# Patient Record
Sex: Female | Born: 1985
Health system: Southern US, Community
[De-identification: ages and names within clinical notes are randomized; demographics above are authoritative.]

## PROBLEM LIST (undated history)

## (undated) DIAGNOSIS — T7840XA Allergy, unspecified, initial encounter: Secondary | ICD-10-CM

## (undated) DIAGNOSIS — K219 Gastro-esophageal reflux disease without esophagitis: Secondary | ICD-10-CM

## (undated) DIAGNOSIS — O039 Complete or unspecified spontaneous abortion without complication: Secondary | ICD-10-CM

## (undated) DIAGNOSIS — A749 Chlamydial infection, unspecified: Secondary | ICD-10-CM

## (undated) DIAGNOSIS — D649 Anemia, unspecified: Secondary | ICD-10-CM

## (undated) DIAGNOSIS — O24419 Gestational diabetes mellitus in pregnancy, unspecified control: Secondary | ICD-10-CM

## (undated) DIAGNOSIS — Z5189 Encounter for other specified aftercare: Secondary | ICD-10-CM

## (undated) HISTORY — PX: NO PAST SURGERIES: SHX2092

## (undated) HISTORY — DX: Encounter for other specified aftercare: Z51.89

## (undated) HISTORY — DX: Gastro-esophageal reflux disease without esophagitis: K21.9

## (undated) HISTORY — DX: Anemia, unspecified: D64.9

## (undated) HISTORY — DX: Complete or unspecified spontaneous abortion without complication: O03.9

## (undated) HISTORY — PX: WISDOM TOOTH EXTRACTION: SHX21

## (undated) HISTORY — DX: Allergy, unspecified, initial encounter: T78.40XA

---

## 2004-11-30 ENCOUNTER — Inpatient Hospital Stay (HOSPITAL_COMMUNITY): Admission: AC | Admit: 2004-11-30 | Discharge: 2004-11-30 | Payer: Self-pay

## 2004-12-07 ENCOUNTER — Emergency Department (HOSPITAL_COMMUNITY): Admission: EM | Admit: 2004-12-07 | Discharge: 2004-12-07 | Payer: Self-pay | Admitting: Emergency Medicine

## 2004-12-22 ENCOUNTER — Other Ambulatory Visit: Admission: RE | Admit: 2004-12-22 | Discharge: 2004-12-22 | Payer: Self-pay | Admitting: Family Medicine
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
## 2017-02-26 DIAGNOSIS — J305 Allergic rhinitis due to food: Secondary | ICD-10-CM | POA: Diagnosis not present

## 2017-03-25 DIAGNOSIS — L700 Acne vulgaris: Secondary | ICD-10-CM | POA: Diagnosis not present

## 2017-06-11 DIAGNOSIS — L700 Acne vulgaris: Secondary | ICD-10-CM | POA: Diagnosis not present

## 2017-09-20 ENCOUNTER — Ambulatory Visit: Payer: Self-pay | Admitting: Certified Nurse Midwife

## 2017-10-08 DIAGNOSIS — Z23 Encounter for immunization: Secondary | ICD-10-CM | POA: Diagnosis not present

## 2017-11-26 DIAGNOSIS — N911 Secondary amenorrhea: Secondary | ICD-10-CM | POA: Diagnosis not present

## 2017-11-28 ENCOUNTER — Ambulatory Visit (INDEPENDENT_AMBULATORY_CARE_PROVIDER_SITE_OTHER): Payer: 59 | Admitting: Internal Medicine

## 2017-11-28 ENCOUNTER — Encounter: Payer: Self-pay | Admitting: Internal Medicine

## 2017-11-28 VITALS — BP 116/66 | HR 74 | Temp 97.8°F | Ht 63.5 in | Wt 167.0 lb

## 2017-11-28 DIAGNOSIS — K219 Gastro-esophageal reflux disease without esophagitis: Secondary | ICD-10-CM | POA: Insufficient documentation

## 2017-11-28 DIAGNOSIS — Z3401 Encounter for supervision of normal first pregnancy, first trimester: Secondary | ICD-10-CM | POA: Diagnosis not present

## 2017-11-28 DIAGNOSIS — Z3A01 Less than 8 weeks gestation of pregnancy: Secondary | ICD-10-CM

## 2017-11-28 DIAGNOSIS — J309 Allergic rhinitis, unspecified: Secondary | ICD-10-CM | POA: Diagnosis not present

## 2017-11-28 LAB — URINALYSIS, ROUTINE W REFLEX MICROSCOPIC
Bilirubin Urine: NEGATIVE
Hgb urine dipstick: NEGATIVE
Ketones, ur: NEGATIVE
Leukocytes, UA: NEGATIVE
Nitrite: NEGATIVE
Specific Gravity, Urine: 1.015 (ref 1.000–1.030)
Total Protein, Urine: NEGATIVE
Urine Glucose: NEGATIVE
Urobilinogen, UA: 0.2 (ref 0.0–1.0)
pH: 7 (ref 5.0–8.0)

## 2017-11-28 LAB — COMPREHENSIVE METABOLIC PANEL
ALT: 19 U/L (ref 0–35)
AST: 17 U/L (ref 0–37)
Albumin: 4.4 g/dL (ref 3.5–5.2)
Alkaline Phosphatase: 72 U/L (ref 39–117)
BUN: 14 mg/dL (ref 6–23)
CO2: 25 mEq/L (ref 19–32)
Calcium: 9.7 mg/dL (ref 8.4–10.5)
Chloride: 102 mEq/L (ref 96–112)
Creatinine, Ser: 0.56 mg/dL (ref 0.40–1.20)
GFR: 133.23 mL/min (ref >60.00)
Glucose, Bld: 101 mg/dL — ABNORMAL HIGH (ref 70–99)
Potassium: 3.9 mEq/L (ref 3.5–5.1)
Sodium: 135 mEq/L (ref 135–145)
Total Bilirubin: 0.6 mg/dL (ref 0.2–1.2)
Total Protein: 7.7 g/dL (ref 6.0–8.3)

## 2017-11-28 LAB — T4, FREE: Free T4: 0.96 ng/dL (ref 0.60–1.60)

## 2017-11-28 LAB — LIPID PANEL
Cholesterol: 134 mg/dL (ref 0–200)
HDL: 49 mg/dL (ref >39.00)
LDL Cholesterol: 69 mg/dL (ref 0–99)
NonHDL: 84.52
Total CHOL/HDL Ratio: 3
Triglycerides: 79 mg/dL (ref 0.0–149.0)
VLDL: 15.8 mg/dL (ref 0.0–40.0)

## 2017-11-28 LAB — CBC
HCT: 43.5 % (ref 36.0–46.0)
Hemoglobin: 14.2 g/dL (ref 12.0–15.0)
MCHC: 32.7 g/dL (ref 30.0–36.0)
MCV: 85.6 fl (ref 78.0–100.0)
Platelets: 348 10*3/uL (ref 150.0–400.0)
RBC: 5.08 Mil/uL (ref 3.87–5.11)
RDW: 13.5 % (ref 11.5–15.5)
WBC: 11.8 10*3/uL — ABNORMAL HIGH (ref 4.0–10.5)

## 2017-11-28 LAB — TSH: TSH: 1.32 u[IU]/mL (ref 0.35–4.50)

## 2017-11-28 NOTE — Progress Notes (Signed)
Chief Complaint  Patient presents with  . Annual Exam  . pregnant [redacted] weeks   New patient needs to establish last PCP Eldridge Abrahams FNP 1. [redacted] weeks pregnant 1st pregnancy doing well OB/GYN is Dr. [facility] [place] she had initial appt and another for b/w upcoming  2. GERD intermittently controlled on Ranitidine 150 mg qd will try to avoid eating late a night which can make worse  3. Allergies on Singulair reviewed pregnancy safety unkown will hold for now     Review of Systems  Constitutional: Negative for weight loss.  HENT: Negative for sore throat.   Eyes:       +wears glasses   Respiratory: Negative for shortness of breath.   Cardiovascular: Negative for chest pain.  Gastrointestinal: Negative for abdominal pain and nausea.  Genitourinary: Negative for dysuria.  Musculoskeletal: Negative for joint pain.  Skin: Negative for rash.  Psychiatric/Behavioral: Negative for depression.   Past Medical History:  Diagnosis Date  . Allergy   . GERD (gastroesophageal reflux disease)    Past Surgical History:  Procedure Laterality Date  . WISDOM TOOTH EXTRACTION     Family History  Problem Relation Age of Onset  . Gout Mother   . Hypertension Mother   . Hypertension Father   . Diabetes Father   . Gestational diabetes Sister   . Cancer Other        breast    Social History   Socioeconomic History  . Marital status: Married    Spouse name: Not on file  . Number of children: Not on file  . Years of education: Not on file  . Highest education level: Not on file  Social Needs  . Financial resource strain: Not on file  . Food insecurity - worry: Not on file  . Food insecurity - inability: Not on file  . Transportation needs - medical: Not on file  . Transportation needs - non-medical: Not on file  Occupational History  . Not on file  Tobacco Use  . Smoking status: Never Smoker  . Smokeless tobacco: Never Used  Substance and Sexual Activity   . Alcohol use: No    Frequency: Never  . Drug use: No  . Sexual activity: Not on file  Other Topics Concern  . 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 cetirizine (ZYRTEC) 10 MG tablet Take 10 mg by mouth.  . montelukast (SINGULAIR) 10 MG tablet Take 10 mg by mouth.  . Prenatal Vit-Fe Fumarate-FA (MULTIVITAMIN-PRENATAL) 27-0.8 MG TABS tablet Take 1 tablet by mouth daily at 12 noon.  . ranitidine (ZANTAC) 150 MG capsule Take 150 mg by mouth.  . triamcinolone (NASACORT ALLERGY 24HR) 55 MCG/ACT AERO nasal inhaler Place 2 sprays into the nose daily.   Allergies  Allergen Reactions  . Other Itching    Shellfish caused itching hands with touching but still eats shellfish    No results found for this or any previous visit (from the past 2160 hour(s)). Objective  Body mass index is 29.12 kg/m. Wt Readings from Last 3 Encounters:  11/28/17 167 lb (75.8 kg)   Temp Readings from Last 3 Encounters:  11/28/17 97.8 F (36.6 C) (Oral)   BP Readings from Last 3 Encounters:  11/28/17 116/66   Pulse Readings  from Last 3 Encounters:  11/28/17 74   Pulse oximetry on room air is 99% Physical Exam  Constitutional: She is oriented to person, place, and time and well-developed, well-nourished, and in no distress. Vital signs are normal.  HENT:  Head: Normocephalic and atraumatic.  Mouth/Throat: Oropharynx is clear and moist and mucous membranes are normal.  Eyes: Conjunctivae are normal. Pupils are equal, round, and reactive to light.  Cardiovascular: Normal rate, regular rhythm and normal heart sounds.  Neg leg edema b/l   Pulmonary/Chest: Effort normal and breath sounds normal.  Abdominal: Normal appearance. There is no tenderness.  Neurological: She is alert and oriented to person, place, and time. Gait normal. Gait normal.  Skin: Skin is warm, dry and  intact.  Psychiatric: Mood, memory, affect and judgment normal.  Nursing note and vitals reviewed.  Assessment   1. [redacted] weeks pregnant due 07/17/17  2. GERD 3. Allergic rhinitis  4. HM Plan  1. Do labs CMET, CBC, TSH, lipid, UA, hep B immune and mail to pt  2. Cont meds  3. Will stop Singulair for now  4. Had flu shot 09/2017, Tdap 08/19/13  Pap upcoming OB/GYN h/o nl paps and h/o tx'ed chlamydia in past per pt   Wearing seatbelt and feels safe in relationship   Provider: Dr. Olivia Mackie McLean-Scocuzza

## 2017-11-28 NOTE — Patient Instructions (Addendum)
Please hold singulair for now  Check Tdap status  We will do labs today CMET, CBC, TSH, UA, lipid, hepatitis B immunity Happy Holidays   Heartburn Heartburn is a type of pain or discomfort that can happen in the throat or chest. It is often described as a burning pain. It may also cause a bad taste in the mouth. Heartburn may feel worse when you lie down or bend over. It may be caused by stomach contents that move back up (reflux) into the tube that connects the mouth with the stomach (esophagus). Follow these instructions at home: Take these actions to lessen your discomfort and to help avoid problems. Diet  Follow a diet as told by your doctor. You may need to avoid foods and drinks such as: ? Coffee and tea (with or without caffeine). ? Drinks that contain alcohol. ? Energy drinks and sports drinks. ? Carbonated drinks or sodas. ? Chocolate and cocoa. ? Peppermint and mint flavorings. ? Garlic and onions. ? Horseradish. ? Spicy and acidic foods, such as peppers, chili powder, curry powder, vinegar, hot sauces, and BBQ sauce. ? Citrus fruit juices and citrus fruits, such as oranges, lemons, and limes. ? Tomato-based foods, such as red sauce, chili, salsa, and pizza with red sauce. ? Fried and fatty foods, such as donuts, french fries, potato chips, and high-fat dressings. ? High-fat meats, such as hot dogs, rib eye steak, sausage, ham, and bacon. ? High-fat dairy items, such as whole milk, butter, and cream cheese.  Eat small meals often. Avoid eating large meals.  Avoid drinking large amounts of liquid with your meals.  Avoid eating meals during the 2-3 hours before bedtime.  Avoid lying down right after you eat.  Do not exercise right after you eat. General instructions  Pay attention to any changes in your symptoms.  Take over-the-counter and prescription medicines only as told by your doctor. Do not take aspirin, ibuprofen, or other NSAIDs unless your doctor says it is  okay.  Do not use any tobacco products, including cigarettes, chewing tobacco, and e-cigarettes. If you need help quitting, ask your doctor.  Wear loose clothes. Do not wear anything tight around your waist.  Raise (elevate) the head of your bed about 6 inches (15 cm).  Try to lower your stress. If you need help doing this, ask your doctor.  If you are overweight, lose an amount of weight that is healthy for you. Ask your doctor about a safe weight loss goal.  Keep all follow-up visits as told by your doctor. This is important. Contact a doctor if:  You have new symptoms.  You lose weight and you do not know why it is happening.  You have trouble swallowing, or it hurts to swallow.  You have wheezing or a cough that keeps happening.  Your symptoms do not get better with treatment.  You have heartburn often for more than two weeks. Get help right away if:  You have pain in your arms, neck, jaw, teeth, or back.  You feel sweaty, dizzy, or light-headed.  You have chest pain or shortness of breath.  You throw up (vomit) and your throw up looks like blood or coffee grounds.  Your poop (stool) is bloody or black. This information is not intended to replace advice given to you by your health care provider. Make sure you discuss any questions you have with your health care provider. Document Released: 08/29/2011 Document Revised: 05/24/2016 Document Reviewed: 04/13/2015 Elsevier Interactive Patient Education  2018 [place] for Gastroesophageal Reflux Disease, Adult When you have gastroesophageal reflux disease (GERD), the foods you eat and your eating habits are very important. Choosing the right foods can help ease your discomfort. What guidelines do I need to follow?  Choose fruits, vegetables, whole grains, and low-fat dairy products.  Choose low-fat meat, fish, and poultry.  Limit fats such as oils, salad dressings, butter, nuts, and avocado.  Keep a  food diary. This helps you identify foods that cause symptoms.  Avoid foods that cause symptoms. These may be different for everyone.  Eat small meals often instead of 3 large meals a day.  Eat your meals slowly, in a place where you are relaxed.  Limit fried foods.  Cook foods using methods other than frying.  Avoid drinking alcohol.  Avoid drinking large amounts of liquids with your meals.  Avoid bending over or lying down until 2-3 hours after eating. What foods are not recommended? These are some foods and drinks that may make your symptoms worse: Vegetables Tomatoes. Tomato juice. Tomato and spaghetti sauce. Chili peppers. Onion and garlic. Horseradish. Fruits Oranges, grapefruit, and lemon (fruit and juice). Meats High-fat meats, fish, and poultry. This includes hot dogs, ribs, ham, sausage, salami, and bacon. Dairy Whole milk and chocolate milk. Sour cream. Cream. Butter. Ice cream. Cream cheese. Drinks Coffee and tea. Bubbly (carbonated) drinks or energy drinks. Condiments Hot sauce. Barbecue sauce. Sweets/Desserts Chocolate and cocoa. Donuts. Peppermint and spearmint. Fats and Oils High-fat foods. This includes [place] fries and potato chips. Other Vinegar. Strong spices. This includes black pepper, white pepper, red pepper, cayenne, curry powder, cloves, ginger, and chili powder. The items listed above may not be a complete list of foods and drinks to avoid. Contact your dietitian for more information. This information is not intended to replace advice given to you by your health care provider. Make sure you discuss any questions you have with your health care provider. Document Released: 06/17/2012 Document Revised: 05/24/2016 Document Reviewed: 10/21/2013 Elsevier Interactive Patient Education  2017 [employer].  How a Baby Grows During Pregnancy Pregnancy begins when a female's sperm enters a female's egg (fertilization). This happens in one of the tubes  (fallopian tubes) that connect the ovaries to the womb (uterus). The fertilized egg is called an embryo until it reaches 10 weeks. From 10 weeks until birth, it is called a fetus. The fertilized egg moves down the fallopian tube to the uterus. Then it implants into the lining of the uterus and begins to grow. The developing fetus receives oxygen and nutrients through the pregnant woman's bloodstream and the tissues that grow (placenta) to support the fetus. The placenta is the life support system for the fetus. It provides nutrition and removes waste. Learning as much as you can about your pregnancy and how your baby is developing can help you enjoy the experience. It can also make you aware of when there might be a problem and when to ask questions. How long does a typical pregnancy last? A pregnancy usually lasts 280 days, or about 40 weeks. Pregnancy is divided into three trimesters:  First trimester: 0-13 weeks.  Second trimester: 14-27 weeks.  Third trimester: 28-40 weeks.  The day when your baby is considered ready to be born (full term) is your estimated date of delivery. How does my baby develop month by month? First month  The fertilized egg attaches to the inside of the uterus.  Some cells will form the placenta. Others  will form the fetus.  The arms, legs, brain, spinal cord, lungs, and heart begin to develop.  At the end of the first month, the heart begins to beat.  Second month  The bones, inner ear, eyelids, hands, and feet form.  The genitals develop.  By the end of 8 weeks, all major organs are developing.  Third month  All of the internal organs are forming.  Teeth develop below the gums.  Bones and muscles begin to grow. The spine can flex.  The skin is transparent.  Fingernails and toenails begin to form.  Arms and legs continue to grow longer, and hands and feet develop.  The fetus is about 3 in (7.6 cm) long.  Fourth month  The placenta is  completely formed.  The external sex organs, neck, outer ear, eyebrows, eyelids, and fingernails are formed.  The fetus can hear, swallow, and move its arms and legs.  The kidneys begin to produce urine.  The skin is covered with a white waxy coating (vernix) and very fine hair (lanugo).  Fifth month  The fetus moves around more and can be felt for the first time (quickening).  The fetus starts to sleep and wake up and may begin to suck its finger.  The nails grow to the end of the fingers.  The organ in the digestive system that makes bile (gallbladder) functions and helps to digest the nutrients.  If your baby is a girl, eggs are present in her ovaries. If your baby is a boy, testicles start to move down into his scrotum.  Sixth month  The lungs are formed, but the fetus is not yet able to breathe.  The eyes open. The brain continues to develop.  Your baby has fingerprints and toe prints. Your baby's hair grows thicker.  At the end of the second trimester, the fetus is about 9 in (22.9 cm) long.  Seventh month  The fetus kicks and stretches.  The eyes are developed enough to sense changes in light.  The hands can make a grasping motion.  The fetus responds to sound.  Eighth month  All organs and body systems are fully developed and functioning.  Bones harden and taste buds develop. The fetus may hiccup.  Certain areas of the brain are still developing. The skull remains soft.  Ninth month  The fetus gains about  lb (0.23 kg) each week.  The lungs are fully developed.  Patterns of sleep develop.  The fetus's head typically moves into a head-down position (vertex) in the uterus to prepare for birth. If the buttocks move into a vertex position instead, the baby is breech.  The fetus weighs 6-9 lbs (2.72-4.08 kg) and is 19-20 in (48.26-50.8 cm) long.  What can I do to have a healthy pregnancy and help my baby develop? Eating and Drinking  Eat a healthy  diet. ? Talk with your health care provider to make sure that you are getting the nutrients that you and your baby need. ? Visit www.BuildDNA.es to learn about creating a healthy diet.  Gain a healthy amount of weight during pregnancy as advised by your health care provider. This is usually 25-35 pounds. You may need to: ? Gain more if you were underweight before getting pregnant or if you are pregnant with more than one baby. ? Gain less if you were overweight or obese when you got pregnant.  Medicines and Vitamins  Take prenatal vitamins as directed by your health care provider. These include  vitamins such as folic acid, iron, calcium, and vitamin D. They are important for healthy development.  Take medicines only as directed by your health care provider. Read labels and ask a pharmacist or your health care provider whether over-the-counter medicines, supplements, and prescription drugs are safe to take during pregnancy.  Activities  Be physically active as advised by your health care provider. Ask your health care provider to recommend activities that are safe for you to do, such as walking or swimming.  Do not participate in strenuous or extreme sports.  Lifestyle  Do not drink alcohol.  Do not use any tobacco products, including cigarettes, chewing tobacco, or electronic cigarettes. If you need help quitting, ask your health care provider.  Do not use illegal drugs.  Safety  Avoid exposure to mercury, lead, or other heavy metals. Ask your health care provider about common sources of these heavy metals.  Avoid listeria infection during pregnancy. Follow these precautions: ? Do not eat soft cheeses or deli meats. ? Do not eat hot dogs unless they have been warmed up to the point of steaming, such as in the microwave oven. ? Do not drink unpasteurized milk.  Avoid toxoplasmosis infection during pregnancy. Follow these precautions: ? Do not change your cat's litter box,  if you have a cat. Ask someone else to do this for you. ? Wear gardening gloves while working in the yard.  General Instructions  Keep all follow-up visits as directed by your health care provider. This is important. This includes prenatal care and screening tests.  Manage any chronic health conditions. Work closely with your health care provider to keep conditions, such as diabetes, under control.  How do I know if my baby is developing well? At each prenatal visit, your health care provider will do several different tests to check on your health and keep track of your baby's development. These include:  Fundal height. ? Your health care provider will measure your growing belly from top to bottom using a tape measure. ? Your health care provider will also feel your belly to determine your baby's position.  Heartbeat. ? An ultrasound in the first trimester can confirm pregnancy and show a heartbeat, depending on how far along you are. ? Your health care provider will check your baby's heart rate at every prenatal visit. ? As you get closer to your delivery date, you may have regular fetal heart rate monitoring to make sure that your baby is not in distress.  Second trimester ultrasound. ? This ultrasound checks your baby's development. It also indicates your baby's gender.  What should I do if I have concerns about my baby's development? Always talk with your health care provider about any concerns that you may have. This information is not intended to replace advice given to you by your health care provider. Make sure you discuss any questions you have with your health care provider. Document Released: 06/04/2008 Document Revised: 05/24/2016 Document Reviewed: 05/26/2014 Elsevier Interactive Patient Education  [employer].

## 2017-11-29 LAB — HEPATITIS B SURFACE ANTIBODY, QUANTITATIVE: Hepatitis B-Post: 111 m[IU]/mL (ref >=10)

## 2017-12-03 LAB — OB RESULTS CONSOLE ABO/RH: RH Type: POSITIVE

## 2017-12-03 LAB — OB RESULTS CONSOLE RUBELLA ANTIBODY, IGM: Rubella: IMMUNE

## 2017-12-03 LAB — OB RESULTS CONSOLE HEPATITIS B SURFACE ANTIGEN: Hepatitis B Surface Ag: NEGATIVE

## 2017-12-03 LAB — OB RESULTS CONSOLE RPR: RPR: NONREACTIVE

## 2017-12-03 LAB — OB RESULTS CONSOLE GC/CHLAMYDIA
Chlamydia: NEGATIVE
Gonorrhea: NEGATIVE

## 2017-12-03 LAB — OB RESULTS CONSOLE HIV ANTIBODY (ROUTINE TESTING): HIV: NONREACTIVE

## 2017-12-31 NOTE — L&D Delivery Note (Signed)
Delivery Note At 9:12 PM a viable female was delivered via Vaginal, Spontaneous (Presentation: ROA).  APGAR: 9, 9; weight pending.   Placenta status: S, I. 3V Cord with the following complications: none.  Cord pH: n/a  Anesthesia:  CLEA Episiotomy: None Lacerations: 2nd degree Suture Repair: 3.0 vicryl rapide Est. Blood Loss (mL):  250  Mom to postpartum.  Baby to Couplet care / Skin to Skin.  Kimberly Cortez, [place] 07/07/2018, 9:40 PM

## 2018-05-07 ENCOUNTER — Encounter: Payer: 59 | Attending: Obstetrics & Gynecology | Admitting: Registered"

## 2018-05-07 DIAGNOSIS — O99810 Abnormal glucose complicating pregnancy: Secondary | ICD-10-CM | POA: Diagnosis present

## 2018-05-07 DIAGNOSIS — Z3A00 Weeks of gestation of pregnancy not specified: Secondary | ICD-10-CM | POA: Diagnosis not present

## 2018-05-12 ENCOUNTER — Encounter: Payer: Self-pay | Admitting: Registered"

## 2018-05-12 DIAGNOSIS — O99810 Abnormal glucose complicating pregnancy: Secondary | ICD-10-CM | POA: Insufficient documentation

## 2018-05-12 NOTE — Progress Notes (Signed)
Patient was seen on 05/07/2018 for Gestational Diabetes self-management class at the [facility]. The following learning objectives were met by the patient during this course:   States the definition of Gestational Diabetes  States why dietary management is important in controlling blood glucose  Describes the effects each nutrient has on blood glucose levels  Demonstrates ability to create a balanced meal plan  Demonstrates carbohydrate counting   States when to check blood glucose levels  Demonstrates proper blood glucose monitoring techniques  States the effect of stress and exercise on blood glucose levels  States the importance of limiting caffeine and abstaining from alcohol and smoking  Blood glucose monitor given: [employer] Lot # [id number] X Exp: 03/31/2019 Blood glucose reading: 105 mg/dL  Patient instructed to monitor glucose levels: FBS: 60 - <95; 1 hour: <140; 2 hour: <120  Patient received handouts:  Nutrition Diabetes and Pregnancy, including carb counting list  Patient will be seen for follow-up as needed.

## 2018-05-30 DIAGNOSIS — Z3A33 33 weeks gestation of pregnancy: Secondary | ICD-10-CM | POA: Diagnosis not present

## 2018-05-30 DIAGNOSIS — O24419 Gestational diabetes mellitus in pregnancy, unspecified control: Secondary | ICD-10-CM | POA: Diagnosis not present

## 2018-06-02 DIAGNOSIS — Z3A33 33 weeks gestation of pregnancy: Secondary | ICD-10-CM | POA: Diagnosis not present

## 2018-06-02 DIAGNOSIS — O368330 Maternal care for abnormalities of the fetal heart rate or rhythm, third trimester, not applicable or unspecified: Secondary | ICD-10-CM | POA: Diagnosis not present

## 2018-06-05 DIAGNOSIS — O9989 Other specified diseases and conditions complicating pregnancy, childbirth and the puerperium: Secondary | ICD-10-CM | POA: Diagnosis not present

## 2018-06-05 DIAGNOSIS — Z3A34 34 weeks gestation of pregnancy: Secondary | ICD-10-CM | POA: Diagnosis not present

## 2018-06-09 DIAGNOSIS — O9989 Other specified diseases and conditions complicating pregnancy, childbirth and the puerperium: Secondary | ICD-10-CM | POA: Diagnosis not present

## 2018-06-09 DIAGNOSIS — Z3A34 34 weeks gestation of pregnancy: Secondary | ICD-10-CM | POA: Diagnosis not present

## 2018-06-12 DIAGNOSIS — Z3A35 35 weeks gestation of pregnancy: Secondary | ICD-10-CM | POA: Diagnosis not present

## 2018-06-12 DIAGNOSIS — O24410 Gestational diabetes mellitus in pregnancy, diet controlled: Secondary | ICD-10-CM | POA: Diagnosis not present

## 2018-06-17 DIAGNOSIS — Z3480 Encounter for supervision of other normal pregnancy, unspecified trimester: Secondary | ICD-10-CM | POA: Diagnosis not present

## 2018-06-17 DIAGNOSIS — Z3A35 35 weeks gestation of pregnancy: Secondary | ICD-10-CM | POA: Diagnosis not present

## 2018-06-17 DIAGNOSIS — O24419 Gestational diabetes mellitus in pregnancy, unspecified control: Secondary | ICD-10-CM | POA: Diagnosis not present

## 2018-06-20 DIAGNOSIS — Z3A36 36 weeks gestation of pregnancy: Secondary | ICD-10-CM | POA: Diagnosis not present

## 2018-06-20 DIAGNOSIS — O9989 Other specified diseases and conditions complicating pregnancy, childbirth and the puerperium: Secondary | ICD-10-CM | POA: Diagnosis not present

## 2018-06-24 DIAGNOSIS — O9989 Other specified diseases and conditions complicating pregnancy, childbirth and the puerperium: Secondary | ICD-10-CM | POA: Diagnosis not present

## 2018-06-24 DIAGNOSIS — Z3A36 36 weeks gestation of pregnancy: Secondary | ICD-10-CM | POA: Diagnosis not present

## 2018-06-27 ENCOUNTER — Telehealth (HOSPITAL_COMMUNITY): Payer: Self-pay | Admitting: *Deleted

## 2018-06-27 ENCOUNTER — Encounter (HOSPITAL_COMMUNITY): Payer: Self-pay | Admitting: *Deleted

## 2018-06-27 DIAGNOSIS — O9989 Other specified diseases and conditions complicating pregnancy, childbirth and the puerperium: Secondary | ICD-10-CM | POA: Diagnosis not present

## 2018-06-27 DIAGNOSIS — Z3A37 37 weeks gestation of pregnancy: Secondary | ICD-10-CM | POA: Diagnosis not present

## 2018-06-30 NOTE — Telephone Encounter (Signed)
Preadmission screen  

## 2018-07-01 ENCOUNTER — Telehealth (HOSPITAL_COMMUNITY): Payer: Self-pay | Admitting: *Deleted

## 2018-07-01 ENCOUNTER — Encounter (HOSPITAL_COMMUNITY): Payer: Self-pay | Admitting: *Deleted

## 2018-07-01 DIAGNOSIS — O24419 Gestational diabetes mellitus in pregnancy, unspecified control: Secondary | ICD-10-CM | POA: Diagnosis not present

## 2018-07-01 DIAGNOSIS — Z3A37 37 weeks gestation of pregnancy: Secondary | ICD-10-CM | POA: Diagnosis not present

## 2018-07-01 NOTE — Telephone Encounter (Signed)
Preadmission screen  

## 2018-07-04 DIAGNOSIS — Z13228 Encounter for screening for other metabolic disorders: Secondary | ICD-10-CM | POA: Diagnosis not present

## 2018-07-04 DIAGNOSIS — Z130 Encounter for screening for diseases of the blood and blood-forming organs and certain disorders involving the immune mechanism: Secondary | ICD-10-CM | POA: Diagnosis not present

## 2018-07-07 ENCOUNTER — Other Ambulatory Visit: Payer: Self-pay

## 2018-07-07 ENCOUNTER — Inpatient Hospital Stay (HOSPITAL_COMMUNITY)
Admission: AD | Admit: 2018-07-07 | Discharge: 2018-07-09 | DRG: 807 | Disposition: A | Discharge status: Home / Self Care | Payer: 59 | Attending: Obstetrics & Gynecology | Admitting: Obstetrics & Gynecology

## 2018-07-07 ENCOUNTER — Encounter (HOSPITAL_COMMUNITY): Payer: Self-pay | Admitting: *Deleted

## 2018-07-07 ENCOUNTER — Inpatient Hospital Stay (HOSPITAL_COMMUNITY): Payer: 59 | Admitting: Anesthesiology

## 2018-07-07 DIAGNOSIS — O24425 Gestational diabetes mellitus in childbirth, controlled by oral hypoglycemic drugs: Secondary | ICD-10-CM | POA: Diagnosis present

## 2018-07-07 DIAGNOSIS — O4292 Full-term premature rupture of membranes, unspecified as to length of time between rupture and onset of labor: Principal | ICD-10-CM | POA: Diagnosis present

## 2018-07-07 DIAGNOSIS — Z3A38 38 weeks gestation of pregnancy: Secondary | ICD-10-CM

## 2018-07-07 DIAGNOSIS — O24429 Gestational diabetes mellitus in childbirth, unspecified control: Secondary | ICD-10-CM | POA: Diagnosis not present

## 2018-07-07 DIAGNOSIS — O4290 Premature rupture of membranes, unspecified as to length of time between rupture and onset of labor, unspecified weeks of gestation: Secondary | ICD-10-CM | POA: Diagnosis present

## 2018-07-07 DIAGNOSIS — Z3490 Encounter for supervision of normal pregnancy, unspecified, unspecified trimester: Secondary | ICD-10-CM

## 2018-07-07 HISTORY — DX: Gestational diabetes mellitus in pregnancy, unspecified control: O24.419

## 2018-07-07 HISTORY — DX: Chlamydial infection, unspecified: A74.9

## 2018-07-07 LAB — CBC
HCT: 35.6 % — ABNORMAL LOW (ref 36.0–46.0)
Hemoglobin: 11.8 g/dL — ABNORMAL LOW (ref 12.0–15.0)
MCH: 27.4 pg (ref 26.0–34.0)
MCHC: 33.1 g/dL (ref 30.0–36.0)
MCV: 82.6 fL (ref 78.0–100.0)
Platelets: 221 10*3/uL (ref 150–400)
RBC: 4.31 MIL/uL (ref 3.87–5.11)
RDW: 14.2 % (ref 11.5–15.5)
WBC: 10.9 10*3/uL — AB (ref 4.0–10.5)

## 2018-07-07 LAB — GLUCOSE, CAPILLARY
GLUCOSE-CAPILLARY: 82 mg/dL (ref 70–99)
Glucose-Capillary: 78 mg/dL (ref 70–99)
Glucose-Capillary: 77 mg/dL (ref 70–99)

## 2018-07-07 LAB — TYPE AND SCREEN
ABO/RH(D): O POS
ANTIBODY SCREEN: NEGATIVE

## 2018-07-07 LAB — ABO/RH: ABO/RH(D): O POS

## 2018-07-07 LAB — POCT FERN TEST: POCT Fern Test: POSITIVE

## 2018-07-07 MED ORDER — LACTATED RINGERS IV SOLN: 500.0000 mL | Freq: Once | INTRAVENOUS | Status: DC

## 2018-07-07 MED ORDER — TERBUTALINE SULFATE 1 MG/ML IJ SOLN
0.2500 mg | Freq: Once | INTRAMUSCULAR | Status: DC | PRN
  Filled 2018-07-07: qty 1

## 2018-07-07 MED ORDER — ONDANSETRON HCL 4 MG/2ML IJ SOLN: 4.0000 mg | INTRAMUSCULAR | Status: DC | PRN

## 2018-07-07 MED ORDER — WITCH HAZEL-GLYCERIN EX PADS: 1.0000 "application " | MEDICATED_PAD | CUTANEOUS | Status: DC | PRN

## 2018-07-07 MED ORDER — OXYTOCIN 40 UNITS IN LACTATED RINGERS INFUSION - SIMPLE MED
1.0000 m[IU]/min | INTRAVENOUS | Status: DC
  Administered 2018-07-07: 4 m[IU]/min via INTRAVENOUS
  Administered 2018-07-07 (×2): 2 m[IU]/min via INTRAVENOUS

## 2018-07-07 MED ORDER — PRENATAL MULTIVITAMIN CH
1.0000 | ORAL_TABLET | Freq: Every day | ORAL | Status: DC
  Administered 2018-07-08: 1 via ORAL
  Filled 2018-07-07 (×2): qty 1

## 2018-07-07 MED ORDER — ZOLPIDEM TARTRATE 5 MG PO TABS: 5.0000 mg | ORAL_TABLET | Freq: Every evening | ORAL | Status: DC | PRN

## 2018-07-07 MED ORDER — ONDANSETRON HCL 4 MG/2ML IJ SOLN: 4.0000 mg | Freq: Four times a day (QID) | INTRAMUSCULAR | Status: DC | PRN

## 2018-07-07 MED ORDER — ACETAMINOPHEN 325 MG PO TABS: 650.0000 mg | ORAL_TABLET | ORAL | Status: DC | PRN

## 2018-07-07 MED ORDER — LIDOCAINE HCL (PF) 1 % IJ SOLN
INTRAMUSCULAR | Status: DC | PRN
  Administered 2018-07-07 (×2): 5 mL via EPIDURAL

## 2018-07-07 MED ORDER — PHENYLEPHRINE 40 MCG/ML (10ML) SYRINGE FOR IV PUSH (FOR BLOOD PRESSURE SUPPORT)
80.0000 ug | PREFILLED_SYRINGE | INTRAVENOUS | Status: DC | PRN
  Filled 2018-07-07: qty 5

## 2018-07-07 MED ORDER — OXYCODONE-ACETAMINOPHEN 5-325 MG PO TABS: 1.0000 | ORAL_TABLET | ORAL | Status: DC | PRN

## 2018-07-07 MED ORDER — FENTANYL CITRATE (PF) 100 MCG/2ML IJ SOLN: 50.0000 ug | INTRAMUSCULAR | Status: DC | PRN

## 2018-07-07 MED ORDER — SENNOSIDES-DOCUSATE SODIUM 8.6-50 MG PO TABS
2.0000 | ORAL_TABLET | ORAL | Status: DC
  Administered 2018-07-09: 2 via ORAL
  Filled 2018-07-07 (×2): qty 2

## 2018-07-07 MED ORDER — PHENYLEPHRINE 40 MCG/ML (10ML) SYRINGE FOR IV PUSH (FOR BLOOD PRESSURE SUPPORT): 80.0000 ug | PREFILLED_SYRINGE | INTRAVENOUS | Status: DC | PRN

## 2018-07-07 MED ORDER — FENTANYL 2.5 MCG/ML BUPIVACAINE 1/10 % EPIDURAL INFUSION (WH - ANES)
14.0000 mL/h | INTRAMUSCULAR | Status: DC | PRN
  Administered 2018-07-07: 12 mL/h via EPIDURAL
  Filled 2018-07-07: qty 100

## 2018-07-07 MED ORDER — LACTATED RINGERS IV SOLN
INTRAVENOUS | Status: DC
  Administered 2018-07-07: 11:00:00 via INTRAVENOUS

## 2018-07-07 MED ORDER — OXYTOCIN BOLUS FROM INFUSION
500.0000 mL | Freq: Once | INTRAVENOUS | Status: AC
  Administered 2018-07-07: 500 mL via INTRAVENOUS

## 2018-07-07 MED ORDER — COCONUT OIL OIL
1.0000 "application " | TOPICAL_OIL | Status: DC | PRN
  Administered 2018-07-09: 1 via TOPICAL
  Filled 2018-07-07: qty 120

## 2018-07-07 MED ORDER — ONDANSETRON HCL 4 MG PO TABS
4.0000 mg | ORAL_TABLET | ORAL | Status: DC | PRN
Start: 2018-07-07 — End: 2018-07-09

## 2018-07-07 MED ORDER — SIMETHICONE 80 MG PO CHEW
80.0000 mg | CHEWABLE_TABLET | ORAL | Status: DC | PRN
Start: 2018-07-07 — End: 2018-07-09

## 2018-07-07 MED ORDER — OXYTOCIN 40 UNITS IN LACTATED RINGERS INFUSION - SIMPLE MED
2.5000 [IU]/h | INTRAVENOUS | Status: DC
  Filled 2018-07-07: qty 1000

## 2018-07-07 MED ORDER — EPHEDRINE 5 MG/ML INJ: 10.0000 mg | INTRAVENOUS | Status: DC | PRN

## 2018-07-07 MED ORDER — LIDOCAINE HCL (PF) 1 % IJ SOLN
30.0000 mL | INTRAMUSCULAR | Status: DC | PRN
  Administered 2018-07-07: 30 mL via SUBCUTANEOUS
  Filled 2018-07-07: qty 30

## 2018-07-07 MED ORDER — FLEET ENEMA 7-19 GM/118ML RE ENEM: 1.0000 | ENEMA | RECTAL | Status: DC | PRN

## 2018-07-07 MED ORDER — TETANUS-DIPHTH-ACELL PERTUSSIS 5-2.5-18.5 LF-MCG/0.5 IM SUSP: 0.5000 mL | Freq: Once | INTRAMUSCULAR | Status: DC

## 2018-07-07 MED ORDER — EPHEDRINE 5 MG/ML INJ
10.0000 mg | INTRAVENOUS | Status: DC | PRN
  Filled 2018-07-07: qty 2

## 2018-07-07 MED ORDER — SOD CITRATE-CITRIC ACID 500-334 MG/5ML PO SOLN: 30.0000 mL | ORAL | Status: DC | PRN

## 2018-07-07 MED ORDER — LACTATED RINGERS IV SOLN
500.0000 mL | Freq: Once | INTRAVENOUS | Status: AC
  Administered 2018-07-07: 500 mL via INTRAVENOUS

## 2018-07-07 MED ORDER — MISOPROSTOL 50MCG HALF TABLET
50.0000 ug | ORAL_TABLET | ORAL | Status: DC
  Administered 2018-07-07: 50 ug via ORAL
  Filled 2018-07-07 (×5): qty 1

## 2018-07-07 MED ORDER — DIBUCAINE 1 % RE OINT: 1.0000 "application " | TOPICAL_OINTMENT | RECTAL | Status: DC | PRN

## 2018-07-07 MED ORDER — OXYCODONE-ACETAMINOPHEN 5-325 MG PO TABS: 2.0000 | ORAL_TABLET | ORAL | Status: DC | PRN

## 2018-07-07 MED ORDER — LACTATED RINGERS IV SOLN: 500.0000 mL | INTRAVENOUS | Status: DC | PRN

## 2018-07-07 MED ORDER — DIPHENHYDRAMINE HCL 50 MG/ML IJ SOLN: 12.5000 mg | INTRAMUSCULAR | Status: DC | PRN

## 2018-07-07 MED ORDER — IBUPROFEN 600 MG PO TABS
600.0000 mg | ORAL_TABLET | Freq: Four times a day (QID) | ORAL | Status: DC
  Administered 2018-07-08 – 2018-07-09 (×7): 600 mg via ORAL
  Filled 2018-07-07 (×8): qty 1

## 2018-07-07 MED ORDER — BENZOCAINE-MENTHOL 20-0.5 % EX AERO
1.0000 "application " | INHALATION_SPRAY | CUTANEOUS | Status: DC | PRN
  Administered 2018-07-08: 1 via TOPICAL
  Filled 2018-07-07: qty 56

## 2018-07-07 MED ORDER — DIPHENHYDRAMINE HCL 25 MG PO CAPS: 25.0000 mg | ORAL_CAPSULE | Freq: Four times a day (QID) | ORAL | Status: DC | PRN

## 2018-07-07 MED ORDER — PHENYLEPHRINE 40 MCG/ML (10ML) SYRINGE FOR IV PUSH (FOR BLOOD PRESSURE SUPPORT)
80.0000 ug | PREFILLED_SYRINGE | INTRAVENOUS | Status: DC | PRN
  Filled 2018-07-07: qty 10
  Filled 2018-07-07: qty 5

## 2018-07-07 NOTE — Progress Notes (Signed)
Patient stated GBS status negative.  Per Dr. Lynnette Caffey GBS status negative according to her prenatal records.  Patient resting comfortably at this time.  Will continue to monitor.

## 2018-07-07 NOTE — [facility] Provider Note (Addendum)
Ms. Kimberly Cortez is a G1P0 at [redacted]w[redacted]d presenting to [facility] for SROM @ [facility]. She endorses an on-going "trickling" since she felt the first leaking of fluid. She denies UC's. She reports good (+) FM today. Pt informed that the ultrasound is considered a limited OB ultrasound and is not intended to be a complete ultrasound exam.  Patient also informed that the ultrasound is not being completed with the intent of assessing for fetal or placental anomalies or any pelvic abnormalities.  Explained that the purpose of today's ultrasound is to assess for  presentation.  Patient acknowledges the purpose of the exam and the limitations of the study.    Fetus is in cephalic presentation during this informal ultrasound exam.   NST - FHR: 145 bpm / moderate variability / accels present / decels absent / TOCO: none    Laury Deep, CNM  07/07/2018 10:13 AM

## 2018-07-07 NOTE — H&P (Signed)
Kimberly Cortez is a 32 y.o. female presenting for PROM at 57 today.  She reports rare, mild CTX.  No VB.  Active FM.  Antepartum course complicated by O8NO well controlled on glyburide 2.5 mg qhs.  GBS negative.  OB History    Gravida  1   Para      Term      Preterm      AB      Living        SAB      TAB      Ectopic      Multiple      Live Births             Past Medical History:  Diagnosis Date  . Allergy   . Chlamydia   . GERD (gastroesophageal reflux disease)   . Gestational diabetes    Past Surgical History:  Procedure Laterality Date  . NO PAST SURGERIES    . WISDOM TOOTH EXTRACTION     Family History: family history includes Cancer in her other; Diabetes in her father and maternal grandmother; Early death in her paternal grandfather; Gestational diabetes in her sister; Gout in her mother; Hypertension in her brother, father, and mother. Social History:  reports that she has never smoked. She has never used smokeless tobacco. She reports that she drank alcohol. She reports that she does not use drugs.     Maternal Diabetes: Yes:  Diabetes Type:  Insulin/Medication controlled Genetic Screening: Normal Maternal Ultrasounds/Referrals: Normal Fetal Ultrasounds or other Referrals:  None Maternal Substance Abuse:  No Significant Maternal Medications:  Meds include: Other: glyburide Significant Maternal Lab Results:  Lab values include: Group B Strep negative Other Comments:  None  ROS Maternal Medical History:  Reason for admission: Rupture of membranes.   Contractions: Onset was 3-5 hours ago.   Frequency: irregular.   Perceived severity is mild.    Fetal activity: Perceived fetal activity is normal.   Last perceived fetal movement was within the past hour.    Prenatal complications: no prenatal complications Prenatal Complications - Diabetes: gestational. Diabetes is managed by oral agent (monotherapy).      Dilation: 1.5 Effacement  (%): 50 Station: -3 Exam by:: katherine g jones RN  Blood pressure 136/82, pulse 98, temperature 98 F (36.7 C), temperature source Oral, resp. rate 20, height 5\' 2"  (1.575 m), weight 190 lb 8 oz (86.4 kg). Maternal Exam:  Uterine Assessment: Contraction strength is mild.  Contraction frequency is irregular.   Abdomen: Fundal height is c/w dates.   Estimated fetal weight is 8#.       Physical Exam  Constitutional: She is oriented to person, place, and time. She appears well-developed and well-nourished.  GI: Soft. There is no tenderness. There is no rebound and no guarding.  Neurological: She is alert and oriented to person, place, and time.  Skin: Skin is warm and dry.  Psychiatric: She has a normal mood and affect. Her behavior is normal.    Prenatal labs: ABO, Rh: --/--/O POS (07/08 1054) Antibody: NEG (07/08 1054) Rubella: Immune (12/04 0000) RPR: Nonreactive (12/04 0000)  HBsAg: Negative (12/04 0000)  HIV: Non-reactive (12/04 0000)  GBS:     Assessment/Plan: 32yo G1 at [redacted]w[redacted]d with PROM -OMP -Epidural when desired -CBG q 4 -Anticipate NSVD   Jewell Ryans 07/07/2018, 2:28 PM

## 2018-07-07 NOTE — Anesthesia Pain Management Evaluation Note (Signed)
  CRNA Pain Management Visit Note  Patient: Kimberly Cortez, 32 y.o., female  "Hello I am a member of the anesthesia team at [facility]. We have an anesthesia team available at all times to provide care throughout the hospital, including epidural management and anesthesia for C-section. I don't know your plan for the delivery whether it a natural birth, water birth, IV sedation, nitrous supplementation, doula or epidural, but we want to meet your pain goals."   1.Was your pain managed to your expectations on prior hospitalizations?   No prior hospitalizations  2.What is your expectation for pain management during this hospitalization?     Epidural  3.How can we help you reach that goal? Epidural @ pai goal.  Record the patient's initial score and the patient's pain goal.   Pain: 1  Pain Goal: 5 [facility] wants you to be able to say your pain was always managed very well.  Josian Lanese 07/07/2018

## 2018-07-07 NOTE — MAU Note (Addendum)
Started leaking clear fluid at 0715, more since. No bleeding. No pain. Some pelvic pressure.  cx has been closed.  Is on Glyburide for gest dm. Pt states GBS was neg

## 2018-07-07 NOTE — Anesthesia Procedure Notes (Signed)
Epidural Patient location during procedure: OB Start time: 07/07/2018 5:30 PM End time: 07/07/2018 5:47 PM  Staffing Anesthesiologist: Annye Asa, MD Performed: anesthesiologist   Preanesthetic Checklist Completed: patient identified, surgical consent, pre-op evaluation, timeout performed, IV checked, risks and benefits discussed and monitors and equipment checked  Epidural Patient position: sitting Prep: site prepped and draped and DuraPrep Patient monitoring: blood pressure, continuous pulse ox and heart rate Approach: midline Location: L3-L4 Injection technique: LOR air  Needle:  Needle type: Tuohy  Needle gauge: 17 G Needle length: 9 cm Needle insertion depth: 5 cm Catheter type: closed end flexible Catheter size: 19 Gauge Catheter at skin depth: 10.5 cm Test dose: negative (1% lidocaine)  Assessment Events: blood not aspirated, injection not painful, no injection resistance, negative IV test and no paresthesia  Additional Notes Pt identified in Labor room.  Monitors applied. Working IV access confirmed. Sterile prep, drape lumbar spine.  1% lido local L 3,4.  #17ga Touhy LOR air at 5 cm L 3,4, cath in easily to 10.5 cm skin. Test dose OK, cath dosed and infusion begun.  Patient asymptomatic, VSS, no heme aspirated, tolerated well.  Jenita Seashore, MDReason for block:procedure for pain

## 2018-07-07 NOTE — Anesthesia Preprocedure Evaluation (Addendum)
Anesthesia Evaluation  Patient identified by MRN, date of birth, ID band Patient awake    Reviewed: Allergy & Precautions, NPO status , Patient's Chart, lab work & pertinent test results  History of Anesthesia Complications Negative for: history of anesthetic complications  Airway Mallampati: II  TM Distance: >3 FB Neck ROM: Full    Dental  (+) Dental Advisory Given   Pulmonary asthma ,    breath sounds clear to auscultation       Cardiovascular negative cardio ROS   Rhythm:Regular Rate:Normal     Neuro/Psych negative neurological ROS     GI/Hepatic Neg liver ROS, GERD  Medicated,  Endo/Other  diabetes (glu 77), Gestational, Oral Hypoglycemic Agents  Renal/GU negative Renal ROS     Musculoskeletal   Abdominal (+) - obese,   Peds  Hematology plt 221k   Anesthesia Other Findings   Reproductive/Obstetrics (+) Pregnancy                            Anesthesia Physical Anesthesia Plan  ASA: II  Anesthesia Plan: Epidural   Post-op Pain Management:    Induction:   PONV Risk Score and Plan: 2 and Treatment may vary due to age or medical condition  Airway Management Planned: Natural Airway  Additional Equipment:   Intra-op Plan:   Post-operative Plan:   Informed Consent: I have reviewed the patients History and Physical, chart, labs and discussed the procedure including the risks, benefits and alternatives for the proposed anesthesia with the patient or authorized representative who has indicated his/her understanding and acceptance.   Dental advisory given  Plan Discussed with:   Anesthesia Plan Comments: (Patient identified. Risks/Benefits/Options discussed with patient including but not limited to bleeding, infection, nerve damage, paralysis, failed block, incomplete pain control, headache, blood pressure changes, nausea, vomiting, reactions to medication both or allergic,  itching and postpartum back pain. Confirmed with bedside nurse the patient's most recent platelet count. Confirmed with patient that they are not currently taking any anticoagulation, have any bleeding history or any family history of bleeding disorders. Patient expressed understanding and wished to proceed. All questions were answered. )       Anesthesia Quick Evaluation

## 2018-07-08 LAB — CBC
HCT: 30.1 % — ABNORMAL LOW (ref 36.0–46.0)
Hemoglobin: 10.1 g/dL — ABNORMAL LOW (ref 12.0–15.0)
MCH: 27.7 pg (ref 26.0–34.0)
MCHC: 33.6 g/dL (ref 30.0–36.0)
MCV: 82.7 fL (ref 78.0–100.0)
PLATELETS: 189 10*3/uL (ref 150–400)
RBC: 3.64 MIL/uL — ABNORMAL LOW (ref 3.87–5.11)
RDW: 14.1 % (ref 11.5–15.5)
WBC: 16.6 10*3/uL — ABNORMAL HIGH (ref 4.0–10.5)

## 2018-07-08 LAB — GLUCOSE, CAPILLARY: GLUCOSE-CAPILLARY: 98 mg/dL (ref 70–99)

## 2018-07-08 LAB — RPR: RPR Ser Ql: NONREACTIVE

## 2018-07-08 MED ORDER — LORATADINE 10 MG PO TABS
5.0000 mg | ORAL_TABLET | Freq: Every evening | ORAL | Status: DC
  Filled 2018-07-08: qty 0.5

## 2018-07-08 MED ORDER — MONTELUKAST SODIUM 10 MG PO TABS
10.0000 mg | ORAL_TABLET | Freq: Every day | ORAL | Status: DC
  Filled 2018-07-08: qty 1

## 2018-07-08 NOTE — Progress Notes (Signed)
Post Partum Day 1 Subjective: no complaints, up ad lib, voiding, tolerating PO and + flatus  Objective: Blood pressure 121/71, pulse (!) 102, temperature 98.1 F (36.7 C), temperature source Oral, resp. rate 18, height 5\' 2"  (1.575 m), weight 190 lb 8 oz (86.4 kg), SpO2 97 %, unknown if currently breastfeeding.  Physical Exam:  General: alert, cooperative and no distress Lochia: appropriate Uterine Fundus: firm Incision: healing well DVT Evaluation: No evidence of DVT seen on physical exam.  Recent Labs    07/07/18 1130 07/08/18 0527  HGB 11.8* 10.1*  HCT 35.6* 30.1*    Assessment/Plan: Plan for discharge tomorrow  Will check fasting glucose this am   LOS: 1 day   Kimberly Cortez 07/08/2018, 7:46 AM

## 2018-07-08 NOTE — Lactation Note (Signed)
This note was copied from a baby's chart. Lactation Consultation Note  Patient Name: Kimberly Cortez Today's Date: 07/08/2018   P1, Baby 13 hours. Reviewed hand expression. Assisted w. latching baby in football hold.   Baby is tongue thrusting and comes off and on breast. Mom encouraged to feed baby 8-12 times/24 hours and with feeding cues.  Reviewed basics. Mom made aware of O/P services, breastfeeding support groups, community resources, and our phone # for post-discharge questions.        Maternal Data    Feeding Feeding Type: Breast Fed Length of feed: 10 min  LATCH Score Latch: Repeated attempts needed to sustain latch, nipple held in mouth throughout feeding, stimulation needed to elicit sucking reflex.  Audible Swallowing: None  Type of Nipple: Everted at rest and after stimulation  Comfort (Breast/Nipple): Soft / non-tender  Hold (Positioning): Assistance needed to correctly position infant at breast and maintain latch.  LATCH Score: 6  Interventions    Lactation Tools Discussed/Used     Consult Status      [name] [facility] 07/08/2018, 11:10 AM

## 2018-07-09 LAB — BIRTH TISSUE RECOVERY COLLECTION (PLACENTA DONATION)

## 2018-07-09 MED ORDER — IBUPROFEN 600 MG PO TABS: 600.0000 mg | ORAL_TABLET | Freq: Four times a day (QID) | ORAL | 0 refills | Status: DC

## 2018-07-09 NOTE — Lactation Note (Addendum)
This note was copied from a baby's chart. Lactation Consultation Note  Patient Name: Kimberly Cortez [id number] Date: 07/09/2018 Reason for consult: Follow-up assessment;Nipple pain/trauma;1st time breastfeeding;Early term 37-38.6wks   Follow up with first time mom of 75 hour old infant. Infant with 9 BF for 10-20 minutes, 3 BF attempts, formula x 1 of 42 cc, 4 voids and 3 stools in the last 24 hours. Infant weight 7 pounds 1.2 ounces with weight loss of 4% since birth. LATCH score of 8.   Infant was crying on mom's chest, mom report she just finished feeding on the left breast. Mom reports BF was painful and she noted a "sore" in the middle of the nipple with bleeding noted. Center of nipple with red pinpoint area to the center of the nipple. No bleeding noted at this time. Reviewed positioning, deepening latch, flanging lips, and supporting infant to keep infant close to the breast with feedings. Comfort gels given for use after BF or pumping and after application of EBM.   Assisted mom in latching infant to the right breast in the cross cradle hold. Infant with upper and lower lips curled in, showed mom how to flange upper and lower lips. Mom reports the latch feels better. Infant fed for about 15 minutes and then fell asleep. Reviewed supply and demand and milk coming to volume. Reviewed normalcy of cluster feedings and feeding with feeding cues. Reviewed formula supplementation volumes based on day of age. Enc mom to BF prior to offering formula to infant to stimulate milk production. Discussed with mom that if she is offering bottle vs BF that I would recommend that she pump when not putting infant to the breast.   Mom given manual pump for home use. She was shown how to use her DEBP kit to double pump and explained how to make a hands free bra from old bra. Mom is planning to call insurance company and order DEBP. Advised mom to take all pump parts home with her.   Reviewed in [place] of  Baby and Me booklet with parents: I/O, signs of dehydration in the infant, signs your infant is getting enough, engorgement prevention/treatment, and breast milk expression and storage.   [facility] Brochure reviewed, mom aware of OP services, BF Support Groups and [place] phone #. Mom plans to call with any questions/concerns as needed.    Maternal Data Has patient been taught Hand Expression?: Yes Does the patient have breastfeeding experience prior to this delivery?: No  Feeding Feeding Type: Breast Fed Length of feed: 25 min  LATCH Score Latch: Grasps breast easily, tongue down, lips flanged, rhythmical sucking.  Audible Swallowing: A few with stimulation  Type of Nipple: Everted at rest and after stimulation  Comfort (Breast/Nipple): Soft / non-tender  Hold (Positioning): Assistance needed to correctly position infant at breast and maintain latch.  LATCH Score: 8  Interventions Interventions: Breast feeding basics reviewed;Support pillows;Assisted with latch;Position options;Skin to skin;Breast compression;Breast massage;Hand express;Expressed milk;Coconut oil;Comfort gels  Lactation Tools Discussed/Used WIC Program: No   Consult Status Consult Status: Complete Follow-up type: Call as needed    Donn Pierini 07/09/2018, 12:07 PM

## 2018-07-09 NOTE — Discharge Summary (Signed)
Obstetric Discharge Summary Reason for Admission: rupture of membranes Prenatal Procedures: ultrasound Intrapartum Procedures: spontaneous vaginal delivery Postpartum Procedures: none Complications-Operative and Postpartum: 2 degree perineal laceration Hemoglobin  Date Value Ref Range Status  07/08/2018 10.1 (L) 12.0 - 15.0 g/dL Final   HCT  Date Value Ref Range Status  07/08/2018 30.1 (L) 36.0 - 46.0 % Final    Physical Exam:  General: alert and cooperative Lochia: appropriate Uterine Fundus: firm Incision: healing well, no significant drainage DVT Evaluation: No evidence of DVT seen on physical exam.  Discharge Diagnoses: Term Pregnancy-delivered  Discharge Information: Date: 07/09/2018 Activity: pelvic rest Diet: routine Medications: PNV and Ibuprofen Condition: stable Instructions: refer to practice specific booklet Discharge to: home Follow-up [place] Schedule an appointment as soon as possible for a visit in 6 week(s).   Contact information: [address] [place] [address] [phone]           Newborn Data: Live born female  Birth Weight: 7 lb 6.5 oz (3359 g) APGAR: 56, 9  Newborn Delivery   Birth date/time:  07/07/2018 21:12:00 Delivery type:  Vaginal, Spontaneous     Home with mother.  Kimberly Cortez 07/09/2018, 8:48 AM

## 2018-07-09 NOTE — Anesthesia Postprocedure Evaluation (Signed)
Anesthesia Post Note  Patient: [place]  Procedure(s) Performed: AN AD HOC LABOR EPIDURAL     Patient location during evaluation: Mother Baby Anesthesia Type: Epidural Level of consciousness: awake and alert Pain management: pain level controlled Vital Signs Assessment: post-procedure vital signs reviewed and stable Respiratory status: spontaneous breathing Cardiovascular status: stable Postop Assessment: no headache, no backache, epidural receding and patient able to bend at knees Anesthetic complications: no Comments: Ambulating without problem    Last Vitals:  Vitals:   07/08/18 2313 07/09/18 0546  BP: (!) 141/78 122/74  Pulse: (!) 106 93  Resp: 16 17  Temp: 36.6 C 36.6 C  SpO2:      Last Pain:  Vitals:   07/09/18 0805  TempSrc:   PainSc: 0-No pain   Pain Goal: Patients Stated Pain Goal: 2 (07/08/18 1140)               Everette Rank

## 2018-07-11 ENCOUNTER — Inpatient Hospital Stay (HOSPITAL_COMMUNITY): Admission: RE | Admit: 2018-07-11 | Payer: 59 | Source: Ambulatory Visit

## 2018-07-31 DIAGNOSIS — N939 Abnormal uterine and vaginal bleeding, unspecified: Secondary | ICD-10-CM | POA: Diagnosis not present

## 2018-08-28 ENCOUNTER — Ambulatory Visit: Payer: 59 | Admitting: Internal Medicine

## 2018-10-02 DIAGNOSIS — Z23 Encounter for immunization: Secondary | ICD-10-CM | POA: Diagnosis not present

## 2018-10-28 ENCOUNTER — Telehealth: Payer: Self-pay | Admitting: Internal Medicine

## 2018-10-28 NOTE — Telephone Encounter (Signed)
Schedule appt 1st available or ask me if having trouble scheduling   [place]

## 2018-10-28 NOTE — Telephone Encounter (Signed)
Copied from [place] [phone]. Topic: Referral - Request for Referral >> Oct 28, 2018 12:15 PM Antonieta Iba C wrote: Reason for CRM:   Has patient seen PCP for this complaint? No  *If NO, is insurance requiring patient see PCP for this issue before PCP can refer them? Referral for which specialty: Preferred provider/office:  GI  Reason for referral: pt had a baby in July. Pt noticed about 3 weeks ago pt had blood in her stool. Pt says that she feel that she may have hemorrhoids. Pt says that she's noticed that 1 time a week it happens. Pt says that she doesn't have any pain.     [phone]

## 2018-10-29 NOTE — Telephone Encounter (Signed)
mychart message has been sent to inform patient. 

## 2018-10-31 ENCOUNTER — Encounter: Payer: Self-pay | Admitting: Family Medicine

## 2018-10-31 ENCOUNTER — Telehealth: Payer: Self-pay | Admitting: Internal Medicine

## 2018-10-31 ENCOUNTER — Ambulatory Visit (INDEPENDENT_AMBULATORY_CARE_PROVIDER_SITE_OTHER): Payer: 59 | Admitting: Family Medicine

## 2018-10-31 VITALS — BP 120/82 | HR 88 | Temp 97.6°F | Ht 63.5 in | Wt 166.0 lb

## 2018-10-31 DIAGNOSIS — R197 Diarrhea, unspecified: Secondary | ICD-10-CM | POA: Diagnosis not present

## 2018-10-31 DIAGNOSIS — K921 Melena: Secondary | ICD-10-CM

## 2018-10-31 LAB — COMPREHENSIVE METABOLIC PANEL
ALBUMIN: 4.4 g/dL (ref 3.5–5.2)
ALK PHOS: 98 U/L (ref 39–117)
ALT: 22 U/L (ref 0–35)
AST: 18 U/L (ref 0–37)
BUN: 15 mg/dL (ref 6–23)
CHLORIDE: 108 meq/L (ref 96–112)
CO2: 25 mEq/L (ref 19–32)
Calcium: 9.2 mg/dL (ref 8.4–10.5)
Creatinine, Ser: 0.59 mg/dL (ref 0.40–1.20)
GFR: 124.72 mL/min (ref >60.00)
Glucose, Bld: 121 mg/dL — ABNORMAL HIGH (ref 70–99)
POTASSIUM: 3.7 meq/L (ref 3.5–5.1)
Sodium: 142 mEq/L (ref 135–145)
Total Bilirubin: 0.4 mg/dL (ref 0.2–1.2)
Total Protein: 7.3 g/dL (ref 6.0–8.3)

## 2018-10-31 LAB — CBC WITH DIFFERENTIAL/PLATELET
BASOS PCT: 0.6 % (ref 0.0–3.0)
Basophils Absolute: 0.1 10*3/uL (ref 0.0–0.1)
Eosinophils Absolute: 0.1 10*3/uL (ref 0.0–0.7)
Eosinophils Relative: 0.9 % (ref 0.0–5.0)
HCT: 39.3 % (ref 36.0–46.0)
HEMOGLOBIN: 13.3 g/dL (ref 12.0–15.0)
LYMPHS ABS: 2 10*3/uL (ref 0.7–4.0)
Lymphocytes Relative: 24.8 % (ref 12.0–46.0)
MCHC: 33.7 g/dL (ref 30.0–36.0)
MCV: 78.6 fl (ref 78.0–100.0)
MONOS PCT: 6.8 % (ref 3.0–12.0)
Monocytes Absolute: 0.5 10*3/uL (ref 0.1–1.0)
Neutro Abs: 5.3 10*3/uL (ref 1.4–7.7)
Neutrophils Relative %: 66.9 % (ref 43.0–77.0)
Platelets: 345 10*3/uL (ref 150.0–400.0)
RBC: 5 Mil/uL (ref 3.87–5.11)
RDW: 15.6 % — AB (ref 11.5–15.5)
WBC: 7.9 10*3/uL (ref 4.0–10.5)

## 2018-10-31 NOTE — Telephone Encounter (Signed)
Copied from [place] [phone]. Topic: General - Inquiry >> Oct 31, 2018 11:36 AM Conception Chancy, NT wrote: Reason for CRM: patient was seen in office today and states she received the 3 step bowel culture but she has an appointment with GI on 11/03/18 she wants to know if she still needs to do these cultures. Voicemail okay.

## 2018-10-31 NOTE — Progress Notes (Signed)
Subjective:    Patient ID: Kimberly Cortez, female    DOB: Mar 28, 1986, 32 y.o.   MRN: [id number]  HPI   Patient presents to clinic due to blood in stool and some diarrhea.  Initially began 3 weeks ago with bright red blood in stool and diarrhea for 2 days.  This cleared up for the next week, then occurred again over the weekend with bright red blood in stool & diarrhea.  Symptoms then cleared up again.   Patient denies any abdominal pain.  Denies any nausea or vomiting.  Denies any known history of hemorrhoids, states believes she had a hemorrhoid while pregnant with her child a few years ago, but has not had any issues since giving birth.  Patient's diet has not changed.  Patient has not traveled to any new places recently.  Patient has no known sick contacts.  Patient denies any Colon, stomach, uterine or ovarian cancer history in the family.  No fever or chills.   Patient Active Problem List   Diagnosis Date Noted  . PROM (premature rupture of membranes) 07/07/2018  . Pregnancy 07/07/2018  . Abnormal maternal glucose tolerance, antepartum 05/12/2018  . Less than [redacted] weeks gestation of pregnancy 11/28/2017  . Gastroesophageal reflux disease 11/28/2017   Social History   Tobacco Use  . Smoking status: Never Smoker  . Smokeless tobacco: Never Used  Substance Use Topics  . Alcohol use: Not Currently    Frequency: Never   Review of Systems  Constitutional: Negative for chills, fatigue and fever.  HENT: Negative for congestion, ear pain, sinus pain and sore throat.   Eyes: Negative.   Respiratory: Negative for cough, shortness of breath and wheezing.   Cardiovascular: Negative for chest pain, palpitations and leg swelling.  Gastrointestinal: Negative for abdominal pain, nausea and vomiting. +blood in stool, some diarrhea.  Genitourinary: Negative for dysuria, frequency and urgency.  Musculoskeletal: Negative for arthralgias and myalgias.  Skin: Negative for color change,  pallor and rash.  Neurological: Negative for syncope, light-headedness and headaches.  Psychiatric/Behavioral: The patient is not nervous/anxious.       Objective:   Physical Exam  Constitutional: She appears well-developed and well-nourished. No distress. Non toxic.   Head: Normocephalic and atraumatic.  Eyes: EOM are normal. No scleral icterus.  Neck: Normal range of motion. Neck supple. No tracheal deviation present.  Cardiovascular: Normal rate, regular rhythm and normal heart sounds.  Pulmonary/Chest: Effort normal and breath sounds normal. No respiratory distress. She has no wheezes. She has no rales.  Abdominal: Soft. Bowel sounds are normal. There is no tenderness.  No guarding, no rebound.  No mass or hernia palpated. Rectal exam: No external hemorrhoid noted, no internal hemorrhoid felt on digital rectal exam. Neurological: She is alert and oriented to person, place, and time.  Gait normal  Skin: Skin is warm and dry. No pallor.  Psychiatric: She has a normal mood and affect. Her behavior is normal. Thought content normal.  Nursing note and vitals reviewed.    Vitals:   10/31/18 0833  BP: 120/82  Pulse: 88  Temp: 97.6 F (36.4 C)  SpO2: 98%   Assessment & Plan:   Blood in stool, Diarrhea - due to patient having blood in stool and diarrhea off and on we will do lab work including CBC, CMP.  We will also do stool culture, stool for ova and parasite and fecal occult blood testing.  Blood in stool and diarrhea is painless when it occurs, I will place  GI referral for further evaluation.  Advised to monitor for any increasing blood in stool, development of pain, fever/chills, nausea vomiting --if any of these worsening symptoms occur advised to call office right away.  Keep regular scheduled follow-up with PCP.  Patient aware she will be called with information regarding her GI referral appointment.

## 2018-11-03 ENCOUNTER — Other Ambulatory Visit (INDEPENDENT_AMBULATORY_CARE_PROVIDER_SITE_OTHER): Payer: 59

## 2018-11-03 ENCOUNTER — Other Ambulatory Visit: Payer: 59

## 2018-11-03 ENCOUNTER — Encounter: Payer: Self-pay | Admitting: Gastroenterology

## 2018-11-03 ENCOUNTER — Ambulatory Visit (INDEPENDENT_AMBULATORY_CARE_PROVIDER_SITE_OTHER): Payer: 59 | Admitting: Gastroenterology

## 2018-11-03 ENCOUNTER — Other Ambulatory Visit: Payer: Self-pay

## 2018-11-03 ENCOUNTER — Encounter: Payer: Self-pay | Admitting: Family Medicine

## 2018-11-03 VITALS — BP 117/76 | HR 87 | Ht 63.5 in | Wt 167.6 lb

## 2018-11-03 DIAGNOSIS — K625 Hemorrhage of anus and rectum: Secondary | ICD-10-CM | POA: Diagnosis not present

## 2018-11-03 DIAGNOSIS — R0989 Other specified symptoms and signs involving the circulatory and respiratory systems: Secondary | ICD-10-CM

## 2018-11-03 DIAGNOSIS — K921 Melena: Secondary | ICD-10-CM

## 2018-11-03 LAB — FECAL OCCULT BLOOD, IMMUNOCHEMICAL: Fecal Occult Bld: POSITIVE — AB

## 2018-11-03 MED ORDER — PEG 3350-KCL-NABCB-NACL-NASULF 236 G PO SOLR: 4000.0000 mL | Freq: Once | ORAL | 0 refills | Status: AC

## 2018-11-03 NOTE — Progress Notes (Signed)
Jonathon Bellows MD, MRCP(U.K) [address]  [place]  [place], [place] [address]  Main: [phone]  Fax: [phone]   Gastroenterology Consultation  Referring Provider:     Jodelle Green, FNP Primary Care Physician:  McLean-Scocuzza, Nino Glow, MD Primary Gastroenterologist:  Dr. Jonathon Bellows  Reason for Consultation:     Blood in the stool         HPI:   Kimberly Cortez is a 32 y.o. y/o female referred for consultation & management  by Dr. Terese Door, Nino Glow, MD.    She has been referred for blood in the stool.   Normal CBC and CMP 01/10/18  Onset and where was blood seen  :first or second of octobver- happended over 2 days and stopped, recurred the following for 3 weeks. Last episode was a week back . Blood on the stool, mixed with it and on the toilet paper, with hard and soft stool Had a baby in July - vaginal delivery - had a tear- 2nd degree  Frequency of bowel movements :once a day - Consistency : normal- formed - does not need to strain- on a stool softner Change in shape of stool:none  Pain associated with bowel movements:none  Blood thinner usage:none  NSAID's: none  Prior colonoscopy :none  She is breastfeeding, pumping.   No family history of colon cancer or polyps.  Family history of colon cancer or polyps:no  Weight loss:no   Been on pepcid for globus sensation ( she works in [employer]), helps with symptoms- ongoing for 2-3 years. No issues with swallowing . Also has allergies,     Past Medical History:  Diagnosis Date  . Allergy   . Chlamydia   . GERD (gastroesophageal reflux disease)   . Gestational diabetes     Past Surgical History:  Procedure Laterality Date  . NO PAST SURGERIES    . WISDOM TOOTH EXTRACTION      Prior to Admission medications   Medication Sig Start Date End Date Taking? Authorizing Provider  famotidine (PEPCID) 20 MG tablet Take 20 mg by mouth 2 (two) times daily.   Yes [provider]  levocetirizine  (XYZAL) 5 MG tablet Take 5 mg by mouth every evening.   Yes [provider]  montelukast (SINGULAIR) 10 MG tablet Take 10 mg by mouth.   Yes [provider]  Prenatal MV-Min-FA-Omega-3 (PRENATAL GUMMIES/DHA & FA PO) Take by mouth.   Yes [provider]  senna-docusate (SENOKOT-S) 8.6-50 MG tablet Take 1 tablet by mouth daily.   Yes [provider]  ibuprofen (ADVIL,MOTRIN) 600 MG tablet Take 1 tablet (600 mg total) by mouth every 6 (six) hours. Patient not taking: Reported on 11/03/2018 07/09/18   Marylynn Pearson, MD  Prenatal Vit-Fe Fumarate-FA (MULTIVITAMIN-PRENATAL) 27-0.8 MG TABS tablet Take 1 tablet by mouth daily at 12 noon.    [provider]  ranitidine (ZANTAC) 150 MG capsule Take 150 mg by mouth 2 (two) times daily.     [provider]    Family History  Problem Relation Age of Onset  . Gout Mother   . Hypertension Mother   . Hypertension Father   . Diabetes Father   . Gestational diabetes Sister   . Cancer Other        breast   . Hypertension Brother   . Diabetes Maternal Grandmother   . Early death Paternal Grandfather      Social History   Tobacco Use  . Smoking status: Never  Smoker  . Smokeless tobacco: Never Used  Substance Use Topics  . Alcohol use: Not Currently    Frequency: Never  . Drug use: No    Allergies as of 11/03/2018 - Review Complete 11/03/2018  Allergen Reaction Noted  . Other Itching 09/14/2014  . Shellfish allergy Itching 11/03/2018    Review of Systems:    All systems reviewed and negative except where noted in HPI.   Physical Exam:  BP 117/76   Pulse 87   Ht 5' 3.5" (1.613 m)   Wt 167 lb 9.6 oz (76 kg)   BMI 29.22 kg/m  No LMP recorded. Psych:  Alert and cooperative. Normal mood and affect. General:   Alert,  Well-developed, well-nourished, pleasant and cooperative in NAD Head:  Normocephalic and atraumatic. Eyes:  Sclera clear, no icterus.   Conjunctiva pink. Ears:  Normal  auditory acuity. Nose:  No deformity, discharge, or lesions. Mouth:  No deformity or lesions,oropharynx pink & moist. Neck:  Supple; no masses or thyromegaly. Lungs:  Respirations even and unlabored.  Clear throughout to auscultation.   No wheezes, crackles, or rhonchi. No acute distress. Heart:  Regular rate and rhythm; no murmurs, clicks, rubs, or gallops. Abdomen:  Normal bowel sounds.  No bruits.  Soft, non-tender and non-distended without masses, hepatosplenomegaly or hernias noted.  No guarding or rebound tenderness.    Msk:  Symmetrical without gross deformities. Good, equal movement & strength bilaterally. Lymph Nodes:  No significant cervical adenopathy. Psych:  Alert and cooperative. Normal mood and affect.  Imaging Studies: No results found.  Assessment and Plan:   Kimberly Cortez is a 32 y.o. y/o female has been referred for rectal bleeding. H/o globus sensation and allergies.   Plan  1. EGD+colonoscopy ( she is breast feeding , will need post op instructions ) Will rule out EOE   I have discussed alternative options, risks & benefits,  which include, but are not limited to, bleeding, infection, perforation,respiratory complication & drug reaction.  The patient agrees with this plan & written consent will be obtained.    Follow up in 8 weeks  Dr Jonathon Bellows MD,MRCP(U.K)

## 2018-11-04 ENCOUNTER — Telehealth: Payer: Self-pay | Admitting: Gastroenterology

## 2018-11-04 NOTE — Telephone Encounter (Signed)
Patient called and needs to r/s her procedure.

## 2018-11-05 ENCOUNTER — Telehealth: Payer: Self-pay

## 2018-11-05 NOTE — Telephone Encounter (Signed)
Patient contacted office to request rescheduling her procedure from 11/20/18 to 11/27 with Dr. Vicente Males.  Referral updated and Wannetta Sender has been notified in Endo.  Thanks [employer]

## 2018-11-05 NOTE — Telephone Encounter (Signed)
Returned patients call to reschedule her colonoscopy with Dr. Vicente Males.  She is currently on for 11/20/18.  LVM for pt to call back.  Thank you,  Sharyn Lull

## 2018-11-07 LAB — OVA AND PARASITE EXAMINATION
CONCENTRATE RESULT: NONE SEEN
MICRO NUMBER:: 91323890
SPECIMEN QUALITY:: ADEQUATE
TRICHROME RESULT: NONE SEEN

## 2018-11-07 LAB — STOOL CULTURE
MICRO NUMBER: 91323888
MICRO NUMBER:: 91323889
MICRO NUMBER:: 91323891
SHIGA RESULT:: NOT DETECTED
SPECIMEN QUALITY: ADEQUATE
SPECIMEN QUALITY: ADEQUATE
SPECIMEN QUALITY:: ADEQUATE

## 2018-11-26 ENCOUNTER — Ambulatory Visit
Admission: RE | Admit: 2018-11-26 | Discharge: 2018-11-26 | Disposition: A | Discharge status: Home / Self Care | Payer: 59 | Source: Ambulatory Visit | Attending: Gastroenterology | Admitting: Gastroenterology

## 2018-11-26 ENCOUNTER — Ambulatory Visit: Payer: 59 | Admitting: Registered Nurse

## 2018-11-26 ENCOUNTER — Encounter
Admission: RE | Disposition: A | Discharge status: Home / Self Care | Payer: Self-pay | Source: Ambulatory Visit | Attending: Gastroenterology

## 2018-11-26 DIAGNOSIS — Z79899 Other long term (current) drug therapy: Secondary | ICD-10-CM | POA: Insufficient documentation

## 2018-11-26 DIAGNOSIS — K640 First degree hemorrhoids: Secondary | ICD-10-CM | POA: Diagnosis not present

## 2018-11-26 DIAGNOSIS — Z8349 Family history of other endocrine, nutritional and metabolic diseases: Secondary | ICD-10-CM | POA: Diagnosis not present

## 2018-11-26 DIAGNOSIS — Z803 Family history of malignant neoplasm of breast: Secondary | ICD-10-CM | POA: Insufficient documentation

## 2018-11-26 DIAGNOSIS — F458 Other somatoform disorders: Secondary | ICD-10-CM | POA: Diagnosis not present

## 2018-11-26 DIAGNOSIS — Z791 Long term (current) use of non-steroidal anti-inflammatories (NSAID): Secondary | ICD-10-CM | POA: Insufficient documentation

## 2018-11-26 DIAGNOSIS — J45909 Unspecified asthma, uncomplicated: Secondary | ICD-10-CM | POA: Insufficient documentation

## 2018-11-26 DIAGNOSIS — Z8249 Family history of ischemic heart disease and other diseases of the circulatory system: Secondary | ICD-10-CM | POA: Insufficient documentation

## 2018-11-26 DIAGNOSIS — Z833 Family history of diabetes mellitus: Secondary | ICD-10-CM | POA: Insufficient documentation

## 2018-11-26 DIAGNOSIS — K625 Hemorrhage of anus and rectum: Secondary | ICD-10-CM | POA: Insufficient documentation

## 2018-11-26 DIAGNOSIS — Z8632 Personal history of gestational diabetes: Secondary | ICD-10-CM | POA: Diagnosis not present

## 2018-11-26 DIAGNOSIS — R0989 Other specified symptoms and signs involving the circulatory and respiratory systems: Secondary | ICD-10-CM | POA: Diagnosis not present

## 2018-11-26 DIAGNOSIS — Z888 Allergy status to other drugs, medicaments and biological substances status: Secondary | ICD-10-CM | POA: Diagnosis not present

## 2018-11-26 DIAGNOSIS — Z91013 Allergy to seafood: Secondary | ICD-10-CM | POA: Insufficient documentation

## 2018-11-26 DIAGNOSIS — K648 Other hemorrhoids: Secondary | ICD-10-CM | POA: Diagnosis not present

## 2018-11-26 DIAGNOSIS — K219 Gastro-esophageal reflux disease without esophagitis: Secondary | ICD-10-CM | POA: Diagnosis not present

## 2018-11-26 HISTORY — PX: COLONOSCOPY WITH PROPOFOL: SHX5780

## 2018-11-26 HISTORY — PX: ESOPHAGOGASTRODUODENOSCOPY (EGD) WITH PROPOFOL: SHX5813

## 2018-11-26 LAB — POCT PREGNANCY, URINE: PREG TEST UR: NEGATIVE

## 2018-11-26 SURGERY — COLONOSCOPY WITH PROPOFOL
Anesthesia: General

## 2018-11-26 MED ORDER — LIDOCAINE HCL (CARDIAC) PF 100 MG/5ML IV SOSY
PREFILLED_SYRINGE | INTRAVENOUS | Status: DC | PRN
  Administered 2018-11-26: 100 mg via INTRAVENOUS

## 2018-11-26 MED ORDER — LIDOCAINE HCL (PF) 2 % IJ SOLN
INTRAMUSCULAR | Status: AC
  Filled 2018-11-26: qty 10

## 2018-11-26 MED ORDER — PROPOFOL 500 MG/50ML IV EMUL
INTRAVENOUS | Status: AC
  Filled 2018-11-26: qty 50

## 2018-11-26 MED ORDER — MIDAZOLAM HCL 2 MG/2ML IJ SOLN
INTRAMUSCULAR | Status: AC
  Filled 2018-11-26: qty 2

## 2018-11-26 MED ORDER — SODIUM CHLORIDE 0.9 % IV SOLN
INTRAVENOUS | Status: DC
  Administered 2018-11-26: 11:00:00 via INTRAVENOUS

## 2018-11-26 MED ORDER — MIDAZOLAM HCL 2 MG/2ML IJ SOLN
INTRAMUSCULAR | Status: DC | PRN
  Administered 2018-11-26: 2 mg via INTRAVENOUS

## 2018-11-26 MED ORDER — PROPOFOL 500 MG/50ML IV EMUL
INTRAVENOUS | Status: DC | PRN
  Administered 2018-11-26: 130 ug/kg/min via INTRAVENOUS

## 2018-11-26 MED ORDER — PROPOFOL 10 MG/ML IV BOLUS
INTRAVENOUS | Status: DC | PRN
  Administered 2018-11-26: 90 mg via INTRAVENOUS
  Administered 2018-11-26: 10 mg via INTRAVENOUS

## 2018-11-26 NOTE — H&P (Signed)
Kimberly Bellows, MD [address] [place], [place], [place], [address] [address], [place], [place], [place], [address] Phone: [phone]  Fax: [phone]  Primary Care Physician:  Kimberly Cortez, Kimberly Glow, MD   Pre-Procedure History & Physical: HPI:  Kimberly Cortez is a 32 y.o. female is here for an endoscopy and colonoscopy    Past Medical History:  Diagnosis Date  . Allergy   . Chlamydia   . GERD (gastroesophageal reflux disease)   . Gestational diabetes     Past Surgical History:  Procedure Laterality Date  . NO PAST SURGERIES    . WISDOM TOOTH EXTRACTION      Prior to Admission medications   Medication Sig Start Date End Date Taking? Authorizing Provider  famotidine (PEPCID) 20 MG tablet Take 20 mg by mouth 2 (two) times daily.   Yes [provider]  levocetirizine (XYZAL) 5 MG tablet Take 5 mg by mouth every evening.   Yes [provider]  montelukast (SINGULAIR) 10 MG tablet Take 10 mg by mouth.   Yes [provider]  ibuprofen (ADVIL,MOTRIN) 600 MG tablet Take 1 tablet (600 mg total) by mouth every 6 (six) hours. Patient not taking: Reported on 11/03/2018 07/09/18   Kimberly Pearson, MD  Prenatal MV-Min-FA-Omega-3 (PRENATAL GUMMIES/DHA & FA PO) Take by mouth.    [provider]  Prenatal Vit-Fe Fumarate-FA (MULTIVITAMIN-PRENATAL) 27-0.8 MG TABS tablet Take 1 tablet by mouth daily at 12 noon.    [provider]  ranitidine (ZANTAC) 150 MG capsule Take 150 mg by mouth 2 (two) times daily.     [provider]  senna-docusate (SENOKOT-S) 8.6-50 MG tablet Take 1 tablet by mouth daily.    [provider]    Allergies as of 11/04/2018 - Review Complete 11/03/2018  Allergen Reaction Noted  . Other Itching 09/14/2014  . Shellfish allergy Itching 11/03/2018    Family History  Problem Relation Age of Onset  . Gout Mother   . Hypertension Mother   . Hypertension Father   . Diabetes Father   .  Gestational diabetes Sister   . Cancer Other        breast   . Hypertension Brother   . Diabetes Maternal Grandmother   . Early death Paternal Grandfather     Social History   Socioeconomic History  . Marital status: Married    Spouse name: Not on file  . Number of children: Not on file  . Years of education: Not on file  . Highest education level: Not on file  Occupational History  . Not on file  Social Needs  . Financial resource strain: Not on file  . Food insecurity:    Worry: Not on file    Inability: Not on file  . Transportation needs:    Medical: Not on file    Non-medical: Not on file  Tobacco Use  . Smoking status: Never Smoker  . Smokeless tobacco: Never Used  Substance and Sexual Activity  . Alcohol use: Yes    Frequency: Never    Comment: none last 24hrs  . Drug use: No  . Sexual activity: Yes  Lifestyle  . Physical activity:    Days per week: Not on file    Minutes per session: Not on file  . Stress: Not on file  Relationships  . Social connections:    Talks on phone: Not on file    Gets together: Not on file    Attends religious  service: Not on file    Active member of club or organization: Not on file    Attends meetings of clubs or organizations: Not on file    Relationship status: Not on file  . Intimate partner violence:    Fear of current or ex partner: Not on file    Emotionally abused: Not on file    Physically abused: Not on file    Forced sexual activity: Not on file  Other Topics Concern  . 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 Neck:  Supple; no masses or thyromegaly. Lungs:  Clear throughout to auscultation, normal respiratory effort.    Heart:  +S1, +S2, Regular rate and rhythm, No edema. Abdomen:  Soft, nontender and nondistended. Normal bowel sounds, without guarding, and without rebound.   Neurologic:  Alert and  oriented x4;  grossly normal neurologically.  Impression/Plan: [name] is here for an endoscopy and colonoscopy  to be performed for  evaluation of globus and rectal bleeding    Risks, benefits, limitations, and alternatives regarding endoscopy have been reviewed with the patient.  Questions have been answered.  All parties agreeable.   Kimberly Bellows, MD  11/26/2018, 11:49 AM

## 2018-11-26 NOTE — Anesthesia Procedure Notes (Signed)
Performed by: Caidyn Henricksen, CRNA Pre-anesthesia Checklist: Patient identified, Emergency Drugs available, Suction available, Patient being monitored and Timeout performed Patient Re-evaluated:Patient Re-evaluated prior to induction Oxygen Delivery Method: Nasal cannula Induction Type: IV induction       

## 2018-11-26 NOTE — Anesthesia Preprocedure Evaluation (Addendum)
Anesthesia Evaluation  Patient identified by MRN, date of birth, ID band Patient awake    Reviewed: Allergy & Precautions, H&P , NPO status , Patient's Chart, lab work & pertinent test results  Airway Mallampati: I  TM Distance: >3 FB     Dental  (+) Teeth Intact   Pulmonary asthma (in chart but patient denies) ,           Cardiovascular negative cardio ROS       Neuro/Psych negative neurological ROS  negative psych ROS   GI/Hepatic Neg liver ROS, GERD  Controlled,  Endo/Other  diabetes (resolved), Gestational  Renal/GU negative Renal ROS  negative genitourinary   Musculoskeletal   Abdominal   Peds  Hematology negative hematology ROS (+)   Anesthesia Other Findings Past Medical History: No date: Allergy No date: Chlamydia No date: GERD (gastroesophageal reflux disease) No date: Gestational diabetes  Past Surgical History: No date: NO PAST SURGERIES No date: WISDOM TOOTH EXTRACTION  BMI    Body Mass Index:  29.81 kg/m      Reproductive/Obstetrics negative OB ROS                            Anesthesia Physical Anesthesia Plan  ASA: II  Anesthesia Plan: General   Post-op Pain Management:    Induction:   PONV Risk Score and Plan: Propofol infusion and TIVA  Airway Management Planned: Natural Airway and Nasal Cannula  Additional Equipment:   Intra-op Plan:   Post-operative Plan:   Informed Consent: I have reviewed the patients History and Physical, chart, labs and discussed the procedure including the risks, benefits and alternatives for the proposed anesthesia with the patient or authorized representative who has indicated his/her understanding and acceptance.   Dental Advisory Given  Plan Discussed with: Anesthesiologist  Anesthesia Plan Comments:        Anesthesia Quick Evaluation

## 2018-11-26 NOTE — Anesthesia Postprocedure Evaluation (Signed)
Anesthesia Post Note  Patient: [place]  Procedure(s) Performed: COLONOSCOPY WITH PROPOFOL (N/A ) ESOPHAGOGASTRODUODENOSCOPY (EGD) WITH PROPOFOL (N/A )  Patient location during evaluation: PACU Anesthesia Type: General Level of consciousness: awake and alert Pain management: pain level controlled Vital Signs Assessment: post-procedure vital signs reviewed and stable Respiratory status: spontaneous breathing, nonlabored ventilation, respiratory function stable and patient connected to nasal cannula oxygen Cardiovascular status: blood pressure returned to baseline and stable Postop Assessment: no apparent nausea or vomiting Anesthetic complications: no     Last Vitals:  Vitals:   11/26/18 1228 11/26/18 1230  BP: 114/78 114/78  Pulse: (!) 107 (!) 109  Resp: 18 20  Temp: 36.5 C   SpO2: 99% 98%    Last Pain:  Vitals:   11/26/18 1300  TempSrc:   PainSc: 0-No pain                 Durenda Hurt

## 2018-11-26 NOTE — Op Note (Signed)
[facility] Gastroenterology Patient Name: Kimberly Cortez Procedure Date: 11/26/2018 11:45 AM MRN: [id number] Account #: [id number] Date of Birth: [birth year] Admit Type: Outpatient Age: 32 Room: [facility] ENDO ROOM 3 Gender: Female Note Status: Finalized Procedure:            Colonoscopy Indications:          Rectal bleeding Providers:            Jonathon Bellows MD, MD Referring MD:         [name] MD, MD (Referring MD) Medicines:            Monitored Anesthesia Care Complications:        No immediate complications. Procedure:            Pre-Anesthesia Assessment:                       - Prior to the procedure, a History and Physical was                        performed, and patient medications, allergies and                        sensitivities were reviewed. The patient's tolerance of                        previous anesthesia was reviewed.                       - The risks and benefits of the procedure and the                        sedation options and risks were discussed with the                        patient. All questions were answered and informed                        consent was obtained.                       - ASA Grade Assessment: II - A patient with mild                        systemic disease.                       After obtaining informed consent, the colonoscope was                        passed under direct vision. Throughout the procedure,                        the patient's blood pressure, pulse, and oxygen                        saturations were monitored continuously. The                        Colonoscope was introduced through the anus and  advanced to the the cecum, identified by the                        appendiceal orifice, IC valve and transillumination.                        The colonoscopy was performed with ease. The patient                        tolerated the procedure well. The quality of the  bowel                        preparation was good. Findings:      The perianal and digital rectal examinations were normal.      Non-bleeding internal hemorrhoids were found during retroflexion. The       hemorrhoids were small and Grade I (internal hemorrhoids that do not       prolapse).      The exam was otherwise without abnormality on direct and retroflexion       views. Impression:           - Non-bleeding internal hemorrhoids.                       - The examination was otherwise normal on direct and                        retroflexion views.                       - No specimens collected. Recommendation:       - Discharge patient to home (with escort).                       - Resume previous diet.                       - Continue present medications.                       - Return to my office PRN. Procedure Code(s):    --- Professional ---                       [phone], Colonoscopy, flexible; diagnostic, including                        collection of specimen(s) by brushing or washing, when                        performed (separate procedure) Diagnosis Code(s):    --- Professional ---                       K64.0, First degree hemorrhoids                       K62.5, Hemorrhage of anus and rectum CPT copyright 2018 American Medical Association. All rights reserved. The codes documented in this report are preliminary and upon coder review may  be revised to meet current compliance requirements. Jonathon Bellows, MD Jonathon Bellows MD, MD 11/26/2018 12:28:03 PM This report has been signed electronically. Number of Addenda: 0 Note Initiated On: 11/26/2018 11:45  AM Scope Withdrawal Time: 0 hours 5 minutes 48 seconds  Total Procedure Duration: 0 hours 13 minutes 50 seconds       [facility]

## 2018-11-26 NOTE — Anesthesia Post-op Follow-up Note (Signed)
Anesthesia QCDR form completed.        

## 2018-11-26 NOTE — Transfer of Care (Signed)
Immediate Anesthesia Transfer of Care Note  Patient: [place]  Procedure(s) Performed: COLONOSCOPY WITH PROPOFOL (N/A ) ESOPHAGOGASTRODUODENOSCOPY (EGD) WITH PROPOFOL (N/A )  Patient Location: PACU  Anesthesia Type:General  Level of Consciousness: drowsy  Airway & Oxygen Therapy: Patient Spontanous Breathing and Patient connected to nasal cannula oxygen  Post-op Assessment: Report given to RN and Post -op Vital signs reviewed and stable  Post vital signs: Reviewed and stable  Last Vitals:  Vitals Value Taken Time  BP 114/78 11/26/2018 12:30 PM  Temp    Pulse 109 11/26/2018 12:30 PM  Resp 20 11/26/2018 12:30 PM  SpO2 98 % 11/26/2018 12:30 PM    Last Pain:  Vitals:   11/26/18 1125  TempSrc: Tympanic  PainSc: 0-No pain         Complications: No apparent anesthesia complications

## 2018-11-26 NOTE — Op Note (Signed)
[facility] Gastroenterology Patient Name: Kimberly Cortez Procedure Date: 11/26/2018 11:47 AM MRN: [id number] Account #: [id number] Date of Birth: [birth year] Admit Type: Outpatient Age: 32 Room: [facility] ENDO ROOM 3 Gender: Female Note Status: Finalized Procedure:            Upper GI endoscopy Indications:          Globus sensation Providers:            Jonathon Bellows MD, MD Referring MD:         [name] MD, MD (Referring MD) Medicines:            Monitored Anesthesia Care Complications:        No immediate complications. Procedure:            Pre-Anesthesia Assessment:                       - Prior to the procedure, a History and Physical was                        performed, and patient medications, allergies and                        sensitivities were reviewed. The patient's tolerance of                        previous anesthesia was reviewed.                       - The risks and benefits of the procedure and the                        sedation options and risks were discussed with the                        patient. All questions were answered and informed                        consent was obtained.                       - ASA Grade Assessment: II - A patient with mild                        systemic disease.                       After obtaining informed consent, the endoscope was                        passed under direct vision. Throughout the procedure,                        the patient's blood pressure, pulse, and oxygen                        saturations were monitored continuously. The Endoscope                        was introduced through the mouth, and advanced to the  third part of duodenum. The upper GI endoscopy was                        accomplished with ease. The patient tolerated the                        procedure well. Findings:      The examined duodenum was normal.      The stomach was normal.  The cardia and gastric fundus were normal on retroflexion.      The examined esophagus was normal. Biopsies were taken with a cold       forceps for histology. Impression:           - Normal examined duodenum.                       - Normal stomach.                       - Normal esophagus. Biopsied. Recommendation:       - Await pathology results.                       - Perform a colonoscopy today. Procedure Code(s):    --- Professional ---                       [id number], Esophagogastroduodenoscopy, flexible, transoral;                        with biopsy, single or multiple Diagnosis Code(s):    --- Professional ---                       F45.8, Other somatoform disorders CPT copyright 2018 American Medical Association. All rights reserved. The codes documented in this report are preliminary and upon coder review may  be revised to meet current compliance requirements. Jonathon Bellows, MD Jonathon Bellows MD, MD 11/26/2018 12:09:40 PM This report has been signed electronically. Number of Addenda: 0 Note Initiated On: 11/26/2018 11:47 AM      [facility]

## 2018-12-01 ENCOUNTER — Encounter: Payer: Self-pay | Admitting: Gastroenterology

## 2018-12-01 LAB — SURGICAL PATHOLOGY

## 2018-12-02 ENCOUNTER — Encounter: Payer: Self-pay | Admitting: Internal Medicine

## 2018-12-02 ENCOUNTER — Ambulatory Visit (INDEPENDENT_AMBULATORY_CARE_PROVIDER_SITE_OTHER): Payer: 59 | Admitting: Internal Medicine

## 2018-12-02 VITALS — BP 126/82 | HR 76 | Temp 97.7°F | Ht 62.0 in | Wt 167.0 lb

## 2018-12-02 DIAGNOSIS — M79671 Pain in right foot: Secondary | ICD-10-CM

## 2018-12-02 DIAGNOSIS — Z13220 Encounter for screening for lipoid disorders: Secondary | ICD-10-CM | POA: Diagnosis not present

## 2018-12-02 DIAGNOSIS — R739 Hyperglycemia, unspecified: Secondary | ICD-10-CM | POA: Diagnosis not present

## 2018-12-02 DIAGNOSIS — Z0000 Encounter for general adult medical examination without abnormal findings: Secondary | ICD-10-CM | POA: Insufficient documentation

## 2018-12-02 DIAGNOSIS — K648 Other hemorrhoids: Secondary | ICD-10-CM

## 2018-12-02 DIAGNOSIS — Z1389 Encounter for screening for other disorder: Secondary | ICD-10-CM

## 2018-12-02 DIAGNOSIS — E559 Vitamin D deficiency, unspecified: Secondary | ICD-10-CM

## 2018-12-02 DIAGNOSIS — M79672 Pain in left foot: Secondary | ICD-10-CM

## 2018-12-02 DIAGNOSIS — Z0184 Encounter for antibody response examination: Secondary | ICD-10-CM | POA: Diagnosis not present

## 2018-12-02 DIAGNOSIS — Z1329 Encounter for screening for other suspected endocrine disorder: Secondary | ICD-10-CM | POA: Diagnosis not present

## 2018-12-02 DIAGNOSIS — Z1159 Encounter for screening for other viral diseases: Secondary | ICD-10-CM

## 2018-12-02 DIAGNOSIS — Z1283 Encounter for screening for malignant neoplasm of skin: Secondary | ICD-10-CM

## 2018-12-02 LAB — TSH: TSH: 1.4 u[IU]/mL (ref 0.35–4.50)

## 2018-12-02 LAB — LIPID PANEL
CHOL/HDL RATIO: 2
Cholesterol: 130 mg/dL (ref 0–200)
HDL: 52 mg/dL (ref >39.00)
LDL CALC: 63 mg/dL (ref 0–99)
NonHDL: 77.95
Triglycerides: 77 mg/dL (ref 0.0–149.0)
VLDL: 15.4 mg/dL (ref 0.0–40.0)

## 2018-12-02 LAB — HEMOGLOBIN A1C: Hgb A1c MFr Bld: 6 % (ref 4.6–6.5)

## 2018-12-02 LAB — T4, FREE: FREE T4: 0.78 ng/dL (ref 0.60–1.60)

## 2018-12-02 LAB — VITAMIN D 25 HYDROXY (VIT D DEFICIENCY, FRACTURES): VITD: 22.45 ng/mL — AB (ref 30.00–100.00)

## 2018-12-02 NOTE — Progress Notes (Signed)
Chief Complaint  Patient presents with  . Annual Exam   Annual  1. B/l midfoot pain new no strenuous activity. Feet hurt in the am and get better throughout the day. They also hurt with long periods of standing. Nothing tried    Review of Systems  Constitutional: Negative for weight loss.  HENT: Negative for hearing loss.   Eyes: Negative for blurred vision.  Respiratory: Negative for shortness of breath.   Cardiovascular: Negative for chest pain.  Gastrointestinal: Negative for abdominal pain.  Musculoskeletal: Negative for falls.  Skin: Negative for rash.  Neurological: Negative for headaches.  Psychiatric/Behavioral: Negative for depression.   Past Medical History:  Diagnosis Date  . Allergy   . Chlamydia   . GERD (gastroesophageal reflux disease)   . Gestational diabetes    Past Surgical History:  Procedure Laterality Date  . COLONOSCOPY WITH PROPOFOL N/A 11/26/2018   Procedure: COLONOSCOPY WITH PROPOFOL;  Surgeon: Jonathon Bellows, MD;  Location: [facility] ENDOSCOPY;  Service: Gastroenterology;  Laterality: N/A;  . ESOPHAGOGASTRODUODENOSCOPY (EGD) WITH PROPOFOL N/A 11/26/2018   Procedure: ESOPHAGOGASTRODUODENOSCOPY (EGD) WITH PROPOFOL;  Surgeon: Jonathon Bellows, MD;  Location: [facility] ENDOSCOPY;  Service: Gastroenterology;  Laterality: N/A;  . NO PAST SURGERIES    . WISDOM TOOTH EXTRACTION     Family History  Problem Relation Age of Onset  . Gout Mother   . Hypertension Mother   . Hypertension Father   . Diabetes Father   . Gestational diabetes Sister   . Cancer Other        breast   . Hypertension Brother   . Diabetes Maternal Grandmother   . Early death Paternal Grandfather    Social History   Socioeconomic History  . Marital status: Married    Spouse name: Not on file  . Number of children: Not on file  . Years of education: Not on file  . Highest education level: Not on file  Occupational History  . Not on file  Social Needs  . Financial resource strain: Not on  file  . Food insecurity:    Worry: Not on file    Inability: Not on file  . Transportation needs:    Medical: Not on file    Non-medical: Not on file  Tobacco Use  . Smoking status: Never Smoker  . Smokeless tobacco: Never Used  Substance and Sexual Activity  . Alcohol use: Yes    Frequency: Never    Comment: none last 24hrs  . Drug use: No  . Sexual activity: Yes  Lifestyle  . Physical activity:    Days per week: Not on file    Minutes per session: Not on file  . Stress: Not on file  Relationships  . Social connections:    Talks on phone: Not on file    Gets together: Not on file    Attends religious service: Not on file    Active member of club or organization: Not on file    Attends meetings of clubs or organizations: Not on file    Relationship status: Not on file  . Intimate partner violence:    Fear of current or ex partner: Not on file    Emotionally abused: Not on file    Physically abused: Not on file    Forced sexual activity: Not on file  Other Topics Concern  . 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 famotidine (PEPCID) 20 MG tablet Take 20 mg by mouth 2 (two) times daily.  Marland Kitchen levocetirizine (XYZAL) 5 MG tablet Take 5 mg by mouth every evening.  . montelukast (SINGULAIR) 10 MG tablet Take 10 mg by mouth.  . Prenatal MV-Min-FA-Omega-3 (PRENATAL GUMMIES/DHA & FA PO) Take by mouth.   Allergies  Allergen Reactions  . Other Itching    Shellfish caused itching hands with touching but still eats shellfish   . Shellfish Allergy Itching   Recent Results (from the past 2160 hour(s))  CBC w/Diff     Status: Abnormal   Collection Time: 10/31/18  9:02 AM  Result Value Ref Range   WBC 7.9 4.0 - 10.5 K/uL   RBC 5.00 3.87 - 5.11 Mil/uL   Hemoglobin 13.3 12.0 - 15.0 g/dL   HCT 39.3 36.0 - 46.0 %   MCV 78.6 78.0 - 100.0 fl   MCHC  33.7 30.0 - 36.0 g/dL   RDW 15.6 (H) 11.5 - 15.5 %   Platelets 345.0 150.0 - 400.0 K/uL   Neutrophils Relative % 66.9 43.0 - 77.0 %   Lymphocytes Relative 24.8 12.0 - 46.0 %   Monocytes Relative 6.8 3.0 - 12.0 %   Eosinophils Relative 0.9 0.0 - 5.0 %   Basophils Relative 0.6 0.0 - 3.0 %   Neutro Abs 5.3 1.4 - 7.7 K/uL   Lymphs Abs 2.0 0.7 - 4.0 K/uL   Monocytes Absolute 0.5 0.1 - 1.0 K/uL   Eosinophils Absolute 0.1 0.0 - 0.7 K/uL   Basophils Absolute 0.1 0.0 - 0.1 K/uL  Comp Met (CMET)     Status: Abnormal   Collection Time: 10/31/18  9:02 AM  Result Value Ref Range   Sodium 142 135 - 145 mEq/L   Potassium 3.7 3.5 - 5.1 mEq/L   Chloride 108 96 - 112 mEq/L   CO2 25 19 - 32 mEq/L   Glucose, Bld 121 (H) 70 - 99 mg/dL   BUN 15 6 - 23 mg/dL   Creatinine, Ser 0.59 0.40 - 1.20 mg/dL   Total Bilirubin 0.4 0.2 - 1.2 mg/dL   Alkaline Phosphatase 98 39 - 117 U/L   AST 18 0 - 37 U/L   ALT 22 0 - 35 U/L   Total Protein 7.3 6.0 - 8.3 g/dL   Albumin 4.4 3.5 - 5.2 g/dL   Calcium 9.2 8.4 - 10.5 mg/dL   GFR 124.72 >60.00 mL/min  Ova and parasite examination     Status: None   Collection Time: 11/03/18  8:19 AM  Result Value Ref Range   MICRO NUMBER: [id number]    SPECIMEN QUALITY: ADEQUATE    Source STOOL    STATUS: FINAL    CONCENTRATE RESULT: No ova or parasites seen    TRICHROME RESULT: No ova or parasites seen    COMMENT:      Routine Ova and Parasite exam may not detect some parasites that occasionally cause diarrheal illness. Test code(s) 36144 (Cryptosporidium Ag., DFA) and/or 10018 (Cyclospora and Isospora Exam) may be ordered to detect these parasites. One negative sample  does not necessarily rule out the presence of a parasitic infection.  For additional information, please refer to https://education.quest[facility].com/faq/FAQ203 (This link is being provided for informational/ educational purposes only.)   Stool Culture     Status: None   Collection Time: 11/03/18  8:19 AM    Result Value Ref Range   MICRO  NUMBER: [id number]    SPECIMEN QUALITY: ADEQUATE    SOURCE: STOOL    STATUS: FINAL    SHIGA RESULT: Not Detected    MICRO NUMBER: [id number]    SPECIMEN QUALITY: ADEQUATE    Source STOOL    STATUS: FINAL    CAM RESULT: No enteric Campylobacter isolated    MICRO NUMBER: [id number]    SPECIMEN QUALITY: ADEQUATE    SOURCE: STOOL    STATUS: FINAL    SS RESULT: No Salmonella or Shigella isolated   Fecal occult blood, imunochemical([facility])     Status: Abnormal   Collection Time: 11/03/18  3:48 PM  Result Value Ref Range   Fecal Occult Bld Positive (A) Negative  Pregnancy, urine POC     Status: None   Collection Time: 11/26/18 11:09 AM  Result Value Ref Range   Preg Test, Ur NEGATIVE NEGATIVE    Comment:        THE SENSITIVITY OF THIS METHODOLOGY IS >24 mIU/mL   Surgical pathology     Status: None   Collection Time: 11/26/18 12:05 PM  Result Value Ref Range   SURGICAL PATHOLOGY      Surgical Pathology CASE: [id number] PATIENT: Kimberly Cortez Surgical Pathology Report     SPECIMEN SUBMITTED: A. Esophagus, r/o eoe; cbx  CLINICAL HISTORY: None provided  PRE-OPERATIVE DIAGNOSIS: Rectal bleeding K62.5, Globus sensation R09.89  POST-OPERATIVE DIAGNOSIS: Normal EGD, small internal hemorrhoids     DIAGNOSIS: A.  ESOPHAGUS; COLD BIOPSY: - UNREMARKABLE SQUAMOUS MUCOSA. - NEGATIVE FOR EOSINOPHILS, DYSPLASIA, AND MALIGNANCY.  GROSS DESCRIPTION: A. Labeled: Esophagus cbx rule out EOE Received: In formalin Tissue fragment(s): Multiple Size: aggregate, 1.1 x 0.5 x 0.1 cm Description: Wispy gray fragments Entirely submitted in 1 cassette.    Final Diagnosis performed by Quay Burow, MD.   Electronically signed 12/01/2018 9:11:43AM The electronic signature indicates that the named Attending Pathologist has evaluated the specimen  Technical component performed at [facility], [address] [place], [place] [address] Lab: [phone]  Dir: Brendia Sacks, MD, MMM  Professional component performed at [facility], [address], [place], [place], [place] [address] Lab: [phone] Dir: Dellia Nims. Rubinas, MD    Objective  Body mass index is 30.54 kg/m. Wt Readings from Last 3 Encounters:  12/02/18 167 lb (75.8 kg)  11/26/18 163 lb (73.9 kg)  11/03/18 167 lb 9.6 oz (76 kg)   Temp Readings from Last 3 Encounters:  12/02/18 97.7 F (36.5 C) (Oral)  11/26/18 97.7 F (36.5 C) (Tympanic)  10/31/18 97.6 F (36.4 C) (Oral)   BP Readings from Last 3 Encounters:  12/02/18 126/82  11/26/18 114/78  11/03/18 117/76   Pulse Readings from Last 3 Encounters:  12/02/18 76  11/26/18 (!) 109  11/03/18 87    Physical Exam  Constitutional: She is oriented to person, place, and time. Vital signs are normal. She appears well-developed and well-nourished. She is cooperative.  HENT:  Head: Normocephalic and atraumatic.  Mouth/Throat: Oropharynx is clear and moist and mucous membranes are normal.  Eyes: Pupils are equal, round, and reactive to light. Conjunctivae are normal.  Cardiovascular: Normal rate, regular rhythm and normal heart sounds.  Pulmonary/Chest: Effort normal and breath sounds normal.  Neurological: She is alert and oriented to person, place, and time. Gait normal.  Skin: Skin is warm, dry and intact.     Psychiatric: She has a normal mood and affect. Her speech is normal and behavior is normal. Judgment and thought content normal. Cognition and memory are  normal.  Nursing note and vitals reviewed.   Assessment   1. Annual  2. B/l foot pain  3. Rectal bleeding with IH on colonoscopy 10/2018  Plan  1.  Check fasting labs today  Tdap utd, ? Flu  Pap utd [facility] Breast exam normal  Cont healthy diet and exercise  2. Consider Xray if continues to w/u arthritis  Prn Tylenol consider warm soaks or ice  3. Had rectal bleeding 2-3 days ago f/u Dr. Vicente Males 12/24.   Provider: Dr.  Olivia Mackie McLean-Scocuzza-Internal Medicine

## 2018-12-02 NOTE — Patient Instructions (Addendum)
Try Epsolm salt soaks or ice packs and Tylenol   Foot Pain Many things can cause foot pain. Some common causes are:  An injury.  A sprain.  Arthritis.  Blisters.  Bunions.  Follow these instructions at home: Pay attention to any changes in your symptoms. Take these actions to help with your discomfort:  If directed, put ice on the affected area: ? Put ice in a plastic bag. ? Place a towel between your skin and the bag. ? Leave the ice on for 15-20 minutes, 3?4 times a day for 2 days.  Take over-the-counter and prescription medicines only as told by your health care provider.  Wear comfortable, supportive shoes that fit you well. Do not wear high heels.  Do not stand or walk for long periods of time.  Do not lift a lot of weight. This can put added pressure on your feet.  Do stretches to relieve foot pain and stiffness as told by your health care provider.  Rub your foot gently.  Keep your feet clean and dry.  Contact a health care provider if:  Your pain does not get better after a few days of self-care.  Your pain gets worse.  You cannot stand on your foot. Get help right away if:  Your foot is numb or tingling.  Your foot or toes are swollen.  Your foot or toes turn white or blue.  You have warmth and redness along your foot. This information is not intended to replace advice given to you by your health care provider. Make sure you discuss any questions you have with your health care provider. Document Released: 01/13/2016 Document Revised: 05/24/2016 Document Reviewed: 01/12/2015 Elsevier Interactive Patient Education  [employer].

## 2018-12-02 NOTE — Progress Notes (Signed)
Pre visit review using our clinic review tool, if applicable. No additional management support is needed unless otherwise documented below in the visit note. 

## 2018-12-03 LAB — URINALYSIS, ROUTINE W REFLEX MICROSCOPIC
BILIRUBIN URINE: NEGATIVE
GLUCOSE, UA: NEGATIVE
Hgb urine dipstick: NEGATIVE
Ketones, ur: NEGATIVE
LEUKOCYTES UA: NEGATIVE
Nitrite: NEGATIVE
PROTEIN: NEGATIVE
SPECIFIC GRAVITY, URINE: 1.021 (ref 1.001–1.03)
pH: 5.5 (ref 5.0–8.0)

## 2018-12-03 LAB — MEASLES/MUMPS/RUBELLA IMMUNITY
Mumps IgG: 28.8 AU/mL
Rubella: 1.16 index
Rubeola IgG: 213 AU/mL

## 2018-12-11 DIAGNOSIS — L906 Striae atrophicae: Secondary | ICD-10-CM | POA: Diagnosis not present

## 2018-12-11 DIAGNOSIS — L700 Acne vulgaris: Secondary | ICD-10-CM | POA: Diagnosis not present

## 2018-12-11 DIAGNOSIS — D2262 Melanocytic nevi of left upper limb, including shoulder: Secondary | ICD-10-CM | POA: Diagnosis not present

## 2018-12-12 ENCOUNTER — Encounter: Payer: Self-pay | Admitting: Internal Medicine

## 2018-12-16 DIAGNOSIS — H61899 Other specified disorders of external ear, unspecified ear: Secondary | ICD-10-CM | POA: Diagnosis not present

## 2018-12-23 ENCOUNTER — Ambulatory Visit (INDEPENDENT_AMBULATORY_CARE_PROVIDER_SITE_OTHER): Payer: 59 | Admitting: Gastroenterology

## 2018-12-23 ENCOUNTER — Encounter: Payer: Self-pay | Admitting: Gastroenterology

## 2018-12-23 VITALS — BP 121/81 | HR 96 | Ht 62.0 in | Wt 166.6 lb

## 2018-12-23 DIAGNOSIS — K625 Hemorrhage of anus and rectum: Secondary | ICD-10-CM

## 2018-12-23 NOTE — Patient Instructions (Signed)

## 2018-12-23 NOTE — Progress Notes (Signed)
   Jonathon Bellows MD, MRCP(U.K) [address]  [place]  [place], [place] [address]  Main: [phone]  Fax: [phone]   Primary Care Physician: McLean-Scocuzza, Nino Glow, MD  Primary Gastroenterologist:  Dr. Jonathon Bellows   No chief complaint on file.   HPI: Kimberly Cortez is a 32 y.o. female   Summary of history :  She was initially referred and seen in 10/2018 for rectal bleeding , began early October for 2 days and stopped, hard stools . She also had some globus sensation   Interval history   11/03/2018-  12/23/2018  11/26/18 :  Colonoscopy : internal hemorrhoids , EGD: Normal -normal esophageal biopsies.    No issues with swallowing , rectal bleeding or hard stools.   Current Outpatient Medications  Medication Sig Dispense Refill  . famotidine (PEPCID) 20 MG tablet Take 20 mg by mouth 2 (two) times daily.    Marland Kitchen levocetirizine (XYZAL) 5 MG tablet Take 5 mg by mouth every evening.    . montelukast (SINGULAIR) 10 MG tablet Take 10 mg by mouth.    . Prenatal MV-Min-FA-Omega-3 (PRENATAL GUMMIES/DHA & FA PO) Take by mouth.     No current facility-administered medications for this visit.     Allergies as of 12/23/2018 - Review Complete 12/02/2018  Allergen Reaction Noted  . Other Itching 09/14/2014  . Shellfish allergy Itching 11/03/2018    ROS:  General: Negative for anorexia, weight loss, fever, chills, fatigue, weakness. ENT: Negative for hoarseness, difficulty swallowing , nasal congestion. CV: Negative for chest pain, angina, palpitations, dyspnea on exertion, peripheral edema.  Respiratory: Negative for dyspnea at rest, dyspnea on exertion, cough, sputum, wheezing.  GI: See history of present illness. GU:  Negative for dysuria, hematuria, urinary incontinence, urinary frequency, nocturnal urination.  Endo: Negative for unusual weight change.    Physical Examination:   There were no vitals taken for this visit.  General: Well-nourished, well-developed  in no acute distress.  Eyes: No icterus. Conjunctivae pink. Mouth: Oropharyngeal mucosa moist and pink , no lesions erythema or exudate. Lungs: Clear to auscultation bilaterally. Non-labored. Heart: Regular rate and rhythm, no murmurs rubs or gallops.  Abdomen: Bowel sounds are normal, nontender, nondistended, no hepatosplenomegaly or masses, no abdominal bruits or hernia , no rebound or guarding.   Extremities: No lower extremity edema. No clubbing or deformities. Neuro: Alert and oriented x 3.  Grossly intact. Skin: Warm and dry, no jaundice.   Psych: Alert and cooperative, normal mood and affect.   Imaging Studies: No results found.  Assessment and Plan:   Kimberly Cortez is a 32 y.o. y/o female here to follow up for rectal bleeding likely secondary to internal hemorrjoids and passage of hard stool , globus sensation . All issues have resolved since last visit  Plan:  1. Stop pepcid 2. High fiber diet     Dr Jonathon Bellows  MD,MRCP [facility]) Follow up in PRN

## 2019-11-12 ENCOUNTER — Telehealth: Payer: Self-pay

## 2019-11-12 ENCOUNTER — Telehealth: Payer: Self-pay | Admitting: Internal Medicine

## 2019-11-12 NOTE — Telephone Encounter (Signed)
Dr. Pryor Ochoa is not allergist he ENT  Does she want allergy or ENT?   [place]

## 2019-11-12 NOTE — Telephone Encounter (Signed)
Copied from [place] [id number]. Topic: General - Other >> Nov 12, 2019 11:15 AM Celene Kras A wrote: Reason for CRM: Pt called and is requesting to have a referral placed for an allergist. Pt is requesting to have the referral placed for Dr. Pryor Ochoa at [facility]. Please advise.

## 2019-11-12 NOTE — Telephone Encounter (Signed)
Pt called to give new insurance information [employer]. Insurance would not stay in chart I tried to put back in chart and it gave a message stating it already exists and will be added.

## 2019-11-13 NOTE — Telephone Encounter (Signed)
Patient is calling to request the referral for Dr. Pryor Ochoa- He cares for her allergy needs. 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 Please advise CB- [phone]

## 2019-11-13 NOTE — Telephone Encounter (Signed)
Left message for patient to return call back. P[facility] may give and obtain information.  

## 2019-11-17 ENCOUNTER — Other Ambulatory Visit: Payer: Self-pay | Admitting: Internal Medicine

## 2019-11-17 DIAGNOSIS — J309 Allergic rhinitis, unspecified: Secondary | ICD-10-CM

## 2019-11-17 NOTE — Telephone Encounter (Signed)
Referral sent ent

## 2019-11-27 MED FILL — AZELASTINE HCL 137 MCG SPRY: 0.1 | 25 days supply | Qty: 30 | Fill #0

## 2019-11-27 MED FILL — MONTELUKAST SOD 10 MG TAB: 10 | 30 days supply | Qty: 30 | Fill #0

## 2019-12-04 ENCOUNTER — Other Ambulatory Visit: Payer: Self-pay

## 2019-12-04 ENCOUNTER — Ambulatory Visit (INDEPENDENT_AMBULATORY_CARE_PROVIDER_SITE_OTHER): Payer: No Typology Code available for payment source | Admitting: Internal Medicine

## 2019-12-04 ENCOUNTER — Encounter: Payer: Self-pay | Admitting: Internal Medicine

## 2019-12-04 VITALS — Ht 62.0 in | Wt 166.0 lb

## 2019-12-04 DIAGNOSIS — Z1389 Encounter for screening for other disorder: Secondary | ICD-10-CM | POA: Diagnosis not present

## 2019-12-04 DIAGNOSIS — E559 Vitamin D deficiency, unspecified: Secondary | ICD-10-CM

## 2019-12-04 DIAGNOSIS — Z1329 Encounter for screening for other suspected endocrine disorder: Secondary | ICD-10-CM

## 2019-12-04 DIAGNOSIS — Z0000 Encounter for general adult medical examination without abnormal findings: Secondary | ICD-10-CM | POA: Diagnosis not present

## 2019-12-04 DIAGNOSIS — Z13220 Encounter for screening for lipoid disorders: Secondary | ICD-10-CM

## 2019-12-04 LAB — LIPID PANEL
Cholesterol: 152 mg/dL (ref 0–200)
HDL: 50.4 mg/dL (ref >39.00)
LDL Cholesterol: 81 mg/dL (ref 0–99)
NonHDL: 101.98
Total CHOL/HDL Ratio: 3
Triglycerides: 103 mg/dL (ref 0.0–149.0)
VLDL: 20.6 mg/dL (ref 0.0–40.0)

## 2019-12-04 LAB — COMPREHENSIVE METABOLIC PANEL
ALT: 45 U/L — ABNORMAL HIGH (ref 0–35)
AST: 28 U/L (ref 0–37)
Albumin: 4.5 g/dL (ref 3.5–5.2)
Alkaline Phosphatase: 78 U/L (ref 39–117)
BUN: 14 mg/dL (ref 6–23)
CO2: 25 mEq/L (ref 19–32)
Calcium: 8.9 mg/dL (ref 8.4–10.5)
Chloride: 101 mEq/L (ref 96–112)
Creatinine, Ser: 0.57 mg/dL (ref 0.40–1.20)
GFR: 121.31 mL/min (ref >60.00)
Glucose, Bld: 98 mg/dL (ref 70–99)
Potassium: 4 mEq/L (ref 3.5–5.1)
Sodium: 137 mEq/L (ref 135–145)
Total Bilirubin: 0.4 mg/dL (ref 0.2–1.2)
Total Protein: 7 g/dL (ref 6.0–8.3)

## 2019-12-04 LAB — CBC WITH DIFFERENTIAL/PLATELET
Basophils Absolute: 0.1 10*3/uL (ref 0.0–0.1)
Basophils Relative: 0.9 % (ref 0.0–3.0)
Eosinophils Absolute: 0.2 10*3/uL (ref 0.0–0.7)
Eosinophils Relative: 2.1 % (ref 0.0–5.0)
HCT: 42 % (ref 36.0–46.0)
Hemoglobin: 14.1 g/dL (ref 12.0–15.0)
Lymphocytes Relative: 28.8 % (ref 12.0–46.0)
Lymphs Abs: 2.2 10*3/uL (ref 0.7–4.0)
MCHC: 33.6 g/dL (ref 30.0–36.0)
MCV: 83.4 fl (ref 78.0–100.0)
Monocytes Absolute: 0.5 10*3/uL (ref 0.1–1.0)
Monocytes Relative: 7.1 % (ref 3.0–12.0)
Neutro Abs: 4.7 10*3/uL (ref 1.4–7.7)
Neutrophils Relative %: 61.1 % (ref 43.0–77.0)
Platelets: 386 10*3/uL (ref 150.0–400.0)
RBC: 5.03 Mil/uL (ref 3.87–5.11)
RDW: 13.2 % (ref 11.5–15.5)
WBC: 7.7 10*3/uL (ref 4.0–10.5)

## 2019-12-04 LAB — TSH: TSH: 0.98 u[IU]/mL (ref 0.35–4.50)

## 2019-12-04 LAB — VITAMIN D 25 HYDROXY (VIT D DEFICIENCY, FRACTURES): VITD: 31.37 ng/mL (ref 30.00–100.00)

## 2019-12-04 NOTE — Progress Notes (Signed)
Virtual Visit via Video Note  I connected with Kimberly Cortez  on 12/04/19 at  8:00 AM EST by a video enabled telemedicine application and verified that I am speaking with the correct person using two identifiers.  Location patient: home Location provider:work or home office Persons participating in the virtual visit: patient, provider  I discussed the limitations of evaluation and management by telemedicine and the availability of in person appointments. The patient expressed understanding and agreed to proceed.   HPI: 1. Annual  Had miscarriage 10/15/19 and will try again in 4-6 weeks per ob/gyn she was crying but coping with miscarriage and stopped singulair due to depression   2. Allergies controlled off singulair with xyzal and nose spray per ent Dr. Pryor Ochoa which she is not using   ROS: See pertinent positives and negatives per HPI. General: wt stable  HEENT: no sore throat  CV: no chest pain  Lungs: no sob  GI: no ab pain  GU+recent miscarriage  MSK: no jt pain Skin: no issues  Neuro: no h/a  Psych: +mood down due to GU issue but doing ok    Past Medical History:  Diagnosis Date  . Allergy   . Chlamydia   . GERD (gastroesophageal reflux disease)   . Gestational diabetes   . Miscarriage    10/15/19    Past Surgical History:  Procedure Laterality Date  . COLONOSCOPY WITH PROPOFOL N/A 11/26/2018   Procedure: COLONOSCOPY WITH PROPOFOL;  Surgeon: Jonathon Bellows, MD;  Location: [facility] ENDOSCOPY;  Service: Gastroenterology;  Laterality: N/A;  . ESOPHAGOGASTRODUODENOSCOPY (EGD) WITH PROPOFOL N/A 11/26/2018   Procedure: ESOPHAGOGASTRODUODENOSCOPY (EGD) WITH PROPOFOL;  Surgeon: Jonathon Bellows, MD;  Location: [facility] ENDOSCOPY;  Service: Gastroenterology;  Laterality: N/A;  . NO PAST SURGERIES    . WISDOM TOOTH EXTRACTION      Family History  Problem Relation Age of Onset  . Gout Mother   . Hypertension Mother   . Hypertension Father   . Diabetes Father   . Gestational diabetes  Sister   . Cancer Other        breast   . Hypertension Brother   . Diabetes Maternal Grandmother   . Early death Paternal Grandfather     SOCIAL HX: married  Works Geophysical data processor     Current Outpatient Medications:  .  azelastine (ASTELIN) 0.1 % nasal spray, Place into both nostrils 2 (two) times daily. Use in each nostril as directed, Disp: , Rfl:  .  levocetirizine (XYZAL) 5 MG tablet, Take 5 mg by mouth every evening., Disp: , Rfl:  .  Prenatal MV-Min-FA-Omega-3 (PRENATAL GUMMIES/DHA & FA PO), Take by mouth., Disp: , Rfl:   EXAM:  VITALS per patient if applicable:  GENERAL: alert, oriented, appears well and in no acute distress  HEENT: atraumatic, conjunttiva clear, no obvious abnormalities on inspection of external nose and ears  NECK: normal movements of the head and neck  LUNGS: on inspection no signs of respiratory distress, breathing rate appears normal, no obvious gross SOB, gasping or wheezing  CV: no obvious cyanosis  MS: moves all visible extremities without noticeable abnormality  PSYCH/NEURO: pleasant and cooperative, no obvious depression or anxiety, speech and thought processing grossly intact  ASSESSMENT AND PLAN:  Discussed the following assessment and plan:  Annual physical exam -  Check fasting labs today  Tdap utd, flu shot had 09/25/19   Pap utd [facility] pap due 11/2020  Cont healthy diet and exercise   -we discussed possible serious and likely  etiologies, options for evaluation and workup, limitations of telemedicine visit vs in person visit, treatment, treatment risks and precautions. Pt prefers to treat via telemedicine empirically rather then risking or undertaking an in person visit at this moment. Patient agrees to seek prompt in person care if worsening, new symptoms arise, or if is not improving with treatment.   I discussed the assessment and treatment plan with the patient. The patient was provided an opportunity to ask questions and all  were answered. The patient agreed with the plan and demonstrated an understanding of the instructions.   The patient was advised to call back or seek an in-person evaluation if the symptoms worsen or if the condition fails to improve as anticipated.  Time spent 20 minutes  Delorise Jackson, MD

## 2019-12-05 LAB — URINALYSIS, ROUTINE W REFLEX MICROSCOPIC
Bilirubin Urine: NEGATIVE
Glucose, UA: NEGATIVE
Hgb urine dipstick: NEGATIVE
Ketones, ur: NEGATIVE
Leukocytes,Ua: NEGATIVE
Nitrite: NEGATIVE
Protein, ur: NEGATIVE
Specific Gravity, Urine: 1.02 (ref 1.001–1.03)
pH: 6.5 (ref 5.0–8.0)

## 2019-12-07 ENCOUNTER — Encounter: Payer: Self-pay | Admitting: Internal Medicine

## 2019-12-22 ENCOUNTER — Ambulatory Visit: Payer: No Typology Code available for payment source | Attending: Internal Medicine

## 2019-12-22 DIAGNOSIS — Z20822 Contact with and (suspected) exposure to covid-19: Secondary | ICD-10-CM

## 2019-12-24 LAB — NOVEL CORONAVIRUS, NAA: SARS-CoV-2, NAA: NOT DETECTED

## 2019-12-31 MED FILL — LEVOCETIRIZINE 5 MG TABLET: 5 | 90 days supply | Qty: 90 | Fill #0

## 2020-03-30 MED FILL — LEVOCETIRIZINE 5 MG TABLET: 5 | 90 days supply | Qty: 90 | Fill #1

## 2020-03-31 ENCOUNTER — Other Ambulatory Visit (HOSPITAL_COMMUNITY): Payer: Self-pay | Admitting: Obstetrics & Gynecology

## 2020-03-31 MED FILL — PRENAISSANCE PLUS SOFTGEL: 28-1-250 | 90 days supply | Qty: 90 | Fill #0

## 2020-06-17 ENCOUNTER — Ambulatory Visit (INDEPENDENT_AMBULATORY_CARE_PROVIDER_SITE_OTHER): Payer: No Typology Code available for payment source | Admitting: Internal Medicine

## 2020-06-17 ENCOUNTER — Encounter: Payer: Self-pay | Admitting: Internal Medicine

## 2020-06-17 ENCOUNTER — Other Ambulatory Visit: Payer: Self-pay

## 2020-06-17 VITALS — BP 126/80 | HR 100 | Temp 97.7°F | Ht 62.0 in | Wt 175.8 lb

## 2020-06-17 DIAGNOSIS — R7303 Prediabetes: Secondary | ICD-10-CM | POA: Diagnosis not present

## 2020-06-17 DIAGNOSIS — R202 Paresthesia of skin: Secondary | ICD-10-CM

## 2020-06-17 DIAGNOSIS — E538 Deficiency of other specified B group vitamins: Secondary | ICD-10-CM

## 2020-06-17 DIAGNOSIS — R748 Abnormal levels of other serum enzymes: Secondary | ICD-10-CM

## 2020-06-17 DIAGNOSIS — R739 Hyperglycemia, unspecified: Secondary | ICD-10-CM | POA: Diagnosis not present

## 2020-06-17 DIAGNOSIS — R519 Headache, unspecified: Secondary | ICD-10-CM

## 2020-06-17 DIAGNOSIS — Z3A01 Less than 8 weeks gestation of pregnancy: Secondary | ICD-10-CM

## 2020-06-17 DIAGNOSIS — R200 Anesthesia of skin: Secondary | ICD-10-CM

## 2020-06-17 LAB — POCT GLYCOSYLATED HEMOGLOBIN (HGB A1C): Hemoglobin A1C: 6 % — AB (ref 4.0–5.6)

## 2020-06-17 MED ORDER — GLUCOSE BLOOD VI STRP: ORAL_STRIP | 12 refills | Status: DC

## 2020-06-17 MED ORDER — ONETOUCH ULTRASOFT LANCETS MISC: 12 refills | Status: DC

## 2020-06-17 MED FILL — FREESTYLE LITE TEST STRIP: 50 days supply | Qty: 50 | Fill #0

## 2020-06-17 MED FILL — FREESTYLE LANCETS: 90 days supply | Qty: 100 | Fill #0

## 2020-06-17 NOTE — Progress Notes (Signed)
Chief Complaint  Patient presents with  . Blood Sugar Problem   F/u  1. [redacted] weeks pregnant and upcoming f/u with Dr. Lanna Poche  2. cbgs at home in the am 114, 130s/140s before food with h/o prediabetes has one touch verio flex machine and checks sugar periodically she is worried with high blood sugar and h/a recently  3. H/a temporal resolved for now was 4/10 now 0/10 nothing tried wears a night guard as she grinds her teeth and also had reduced sleep with 2 y.o daughter and reduced caffeine when found out she was pregnant from 12 oz bid to 8 oz. She had her vision checked 12/202 0 and has been wearing her glasses more for reduced vision 4. Elevated lfts repeat today 5. C/o numbness and tingling in hands and toes  Review of Systems  Constitutional: Negative for weight loss.  Respiratory: Negative for shortness of breath.   Cardiovascular: Negative for chest pain.  Neurological: Negative for headaches.  Endo/Heme/Allergies:       +elevated sugar  Psychiatric/Behavioral: The patient has insomnia.    Past Medical History:  Diagnosis Date  . Allergy   . Chlamydia   . GERD (gastroesophageal reflux disease)   . Gestational diabetes   . Miscarriage    10/15/19   Past Surgical History:  Procedure Laterality Date  . COLONOSCOPY WITH PROPOFOL N/A 11/26/2018   Procedure: COLONOSCOPY WITH PROPOFOL;  Surgeon: Jonathon Bellows, MD;  Location: [facility] ENDOSCOPY;  Service: Gastroenterology;  Laterality: N/A;  . ESOPHAGOGASTRODUODENOSCOPY (EGD) WITH PROPOFOL N/A 11/26/2018   Procedure: ESOPHAGOGASTRODUODENOSCOPY (EGD) WITH PROPOFOL;  Surgeon: Jonathon Bellows, MD;  Location: [facility] ENDOSCOPY;  Service: Gastroenterology;  Laterality: N/A;  . NO PAST SURGERIES    . WISDOM TOOTH EXTRACTION     Family History  Problem Relation Age of Onset  . Gout Mother   . Hypertension Mother   . Hypertension Father   . Diabetes Father   . Gestational diabetes Sister   . Cancer Other        breast   . Hypertension  Brother   . Diabetes Maternal Grandmother   . Early death Paternal Grandfather    Social History   Socioeconomic History  . Marital status: Married    Spouse name: Not on file  . Number of children: Not on file  . Years of education: Not on file  . Highest education level: Not on file  Occupational History  . Not on file  Tobacco Use  . Smoking status: Never Smoker  . Smokeless tobacco: Never Used  Vaping Use  . Vaping Use: Never used  Substance and Sexual Activity  . Alcohol use: Yes    Comment: none last 24hrs  . Drug use: No  . Sexual activity: Yes  Other Topics Concern  . 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 Difficulty of Paying Living Expenses:   Food Insecurity:   . Worried About Charity fundraiser in the Last Year:   . Arboriculturist in the Last Year:   Transportation Needs:   . Film/video editor (Medical):   Marland Kitchen Lack of Transportation (Non-Medical):   Physical Activity:   . Days of Exercise per Week:   .  Minutes of Exercise per Session:   Stress:   . Feeling of Stress :   Social Connections:   . Frequency of Communication with Friends and Family:   . Frequency of Social Gatherings with Friends and Family:   . Attends Religious Services:   . Active Member of Clubs or Organizations:   . Attends Archivist Meetings:   Marland Kitchen Marital Status:   Intimate Partner Violence:   . Fear of Current or Ex-Partner:   . Emotionally Abused:   Marland Kitchen Physically Abused:   . Sexually Abused:    Current Meds  Medication Sig  . levocetirizine (XYZAL) 5 MG tablet Take 5 mg by mouth every evening.  . Prenatal MV-Min-FA-Omega-3 (PRENATAL GUMMIES/DHA & FA PO) Take by mouth.   Allergies  Allergen Reactions  . Other Itching    Shellfish caused itching hands  with touching but still eats shellfish   . Shellfish Allergy Itching   Recent Results (from the past 2160 hour(s))  POCT HgB A1C     Status: Abnormal   Collection Time: 06/17/20  4:20 PM  Result Value Ref Range   Hemoglobin A1C 6.0 (A) 4.0 - 5.6 %   HbA1c POC (<> result, manual entry)     HbA1c, POC (prediabetic range)     HbA1c, POC (controlled diabetic range)     Objective  Body mass index is 32.15 kg/m. Wt Readings from Last 3 Encounters:  06/17/20 175 lb 12.8 oz (79.7 kg)  12/04/19 166 lb (75.3 kg)  12/23/18 166 lb 9.6 oz (75.6 kg)   Temp Readings from Last 3 Encounters:  06/17/20 97.7 F (36.5 C) (Temporal)  12/02/18 97.7 F (36.5 C) (Oral)  11/26/18 97.7 F (36.5 C) (Tympanic)   BP Readings from Last 3 Encounters:  06/17/20 126/80  12/23/18 121/81  12/02/18 126/82   Pulse Readings from Last 3 Encounters:  06/17/20 100  12/23/18 96  12/02/18 76    Physical Exam Vitals and nursing note reviewed.  Constitutional:      Appearance: Normal appearance. She is well-developed and well-groomed. She is obese.  HENT:     Head: Normocephalic and atraumatic.  Eyes:     Conjunctiva/sclera: Conjunctivae normal.     Pupils: Pupils are equal, round, and reactive to light.  Cardiovascular:     Rate and Rhythm: Normal rate and regular rhythm.     Heart sounds: Normal heart sounds. No murmur heard.   Pulmonary:     Effort: Pulmonary effort is normal.     Breath sounds: Normal breath sounds.  Abdominal:     General: Abdomen is flat. Bowel sounds are normal.     Tenderness: There is no abdominal tenderness.  Skin:    General: Skin is warm and dry.  Neurological:     General: No focal deficit present.     Mental Status: She is alert and oriented to person, place, and time. Mental status is at baseline.     Gait: Gait normal.  Psychiatric:        Attention and Perception: Attention and perception normal.        Mood and Affect: Mood and affect normal.        Speech:  Speech normal.        Behavior: Behavior normal. Behavior is cooperative.        Thought Content: Thought content normal.        Cognition and Memory: Cognition and memory normal.        Judgment: Judgment  normal.     Assessment  Plan  Prediabetes - Plan: POCT HgB A1C, glucose blood test strip, Lancets (ONETOUCH ULTRASOFT) lancets Monitor cbg esp now she is pregnant f/u ob/gy Dr. Lanna Poche upcoming appt  Numbness and tingling - Plan: Comprehensive metabolic panel, C28, TSH  Elevated liver enzymes - Plan: Comprehensive metabolic panel  Less than [redacted] weeks gestation of pregnancy See above  Nonintractable headache, unspecified chronicity pattern, could be tension, lack of sleep water, stress, due to grinding teeth or reduction in caffeine  -prn tylenol, wear glasses, increase sleep and water   HM-CPE at f/u  Tdap utd, flu shot had 09/25/19  covid pfizer 2/2   Pap utd [facility] pap due 11/2020  Cont healthy diet and exercise   Provider: Dr. Olivia Mackie [facility]Lean[facility]

## 2020-06-17 NOTE — Progress Notes (Signed)
Patient states she has been having headaches and checked her sugar with her old meter from having gestational diabetes. States her fasting sugars was in the 140's.

## 2020-06-17 NOTE — Patient Instructions (Addendum)
Tylenol as needed   General Headache Without Cause A headache is pain or discomfort felt around the head or neck area. The specific cause of a headache may not be found. There are many causes and types of headaches. A few common ones are:  Tension headaches.  Migraine headaches.  Cluster headaches.  Chronic daily headaches. Follow these instructions at home: Watch your condition for any changes. Let your health care provider know about them. Take these steps to help with your condition: Managing pain      Take over-the-counter and prescription medicines only as told by your health care provider.  Lie down in a dark, quiet room when you have a headache.  If directed, put ice on your head and neck area: ? Put ice in a plastic bag. ? Place a towel between your skin and the bag. ? Leave the ice on for 20 minutes, 2-3 times per day.  If directed, apply heat to the affected area. Use the heat source that your health care provider recommends, such as a moist heat pack or a heating pad. ? Place a towel between your skin and the heat source. ? Leave the heat on for 20-30 minutes. ? Remove the heat if your skin turns bright red. This is especially important if you are unable to feel pain, heat, or cold. You may have a greater risk of getting burned.  Keep lights dim if bright lights bother you or make your headaches worse. Eating and drinking  Eat meals on a regular schedule.  If you drink alcohol: ? Limit how much you use to:  0-1 drink a day for women.  0-2 drinks a day for men. ? Be aware of how much alcohol is in your drink. In the [place] one drink equals one 12 oz bottle of beer (355 mL), one 5 oz glass of wine (148 mL), or one 1 oz glass of hard liquor (44 mL).  Stop drinking caffeine, or decrease the amount of caffeine you drink. General instructions   Keep a headache journal to help find out what may trigger your headaches. For example, write down: ? What you eat and  drink. ? How much sleep you get. ? Any change to your diet or medicines.  Try massage or other relaxation techniques.  Limit stress.  Sit up straight, and do not tense your muscles.  Do not use any products that contain nicotine or tobacco, such as cigarettes, e-cigarettes, and chewing tobacco. If you need help quitting, ask your health care provider.  Exercise regularly as told by your health care provider.  Sleep on a regular schedule. Get 7-9 hours of sleep each night, or the amount recommended by your health care provider.  Keep all follow-up visits as told by your health care provider. This is important. Contact a health care provider if:  Your symptoms are not helped by medicine.  You have a headache that is different from the usual headache.  You have nausea or you vomit.  You have a fever. Get help right away if:  Your headache becomes severe quickly.  Your headache gets worse after moderate to intense physical activity.  You have repeated vomiting.  You have a stiff neck.  You have a loss of vision.  You have problems with speech.  You have pain in the eye or ear.  You have muscular weakness or loss of muscle control.  You lose your balance or have trouble walking.  You feel faint or pass out.  You have confusion.  You have a seizure. Summary  A headache is pain or discomfort felt around the head or neck area.  There are many causes and types of headaches. In some cases, the cause may not be found.  Keep a headache journal to help find out what may trigger your headaches. Watch your condition for any changes. Let your health care provider know about them.  Contact a health care provider if you have a headache that is different from the usual headache, or if your symptoms are not helped by medicine.  Get help right away if your headache becomes severe, you vomit, you have a loss of vision, you lose your balance, or you have a seizure. This  information is not intended to replace advice given to you by your health care provider. Make sure you discuss any questions you have with your health care provider. Document Revised: 07/07/2018 Document Reviewed: 07/07/2018 Elsevier Patient Education  2020 [place].  Prediabetes Eating Plan Prediabetes is a condition that causes blood sugar (glucose) levels to be higher than normal. This increases the risk for developing diabetes. In order to prevent diabetes from developing, your health care provider may recommend a diet and other lifestyle changes to help you:  Control your blood glucose levels.  Improve your cholesterol levels.  Manage your blood pressure. Your health care provider may recommend working with a diet and nutrition specialist (dietitian) to make a meal plan that is best for you. What are tips for following this plan? Lifestyle  Set weight loss goals with the help of your health care team. It is recommended that most people with prediabetes lose 7% of their current body weight.  Exercise for at least 30 minutes at least 5 days a week.  Attend a support group or seek ongoing support from a mental health counselor.  Take over-the-counter and prescription medicines only as told by your health care provider. Reading food labels  Read food labels to check the amount of fat, salt (sodium), and sugar in prepackaged foods. Avoid foods that have: ? Saturated fats. ? Trans fats. ? Added sugars.  Avoid foods that have more than 300 milligrams (mg) of sodium per serving. Limit your daily sodium intake to less than 2,300 mg each day. Shopping  Avoid buying pre-made and processed foods. Cooking  Cook with olive oil. Do not use butter, lard, or ghee.  Bake, broil, grill, or boil foods. Avoid frying. Meal planning   Work with your dietitian to develop an eating plan that is right for you. This may include: ? Tracking how many calories you take in. Use a food diary,  notebook, or mobile application to track what you eat at each meal. ? Using the glycemic index (GI) to plan your meals. The index tells you how quickly a food will raise your blood glucose. Choose low-GI foods. These foods take a longer time to raise blood glucose.  Consider following a Mediterranean diet. This diet includes: ? Several servings each day of fresh fruits and vegetables. ? Eating fish at least twice a week. ? Several servings each day of whole grains, beans, nuts, and seeds. ? Using olive oil instead of other fats. ? Moderate alcohol consumption. ? Eating small amounts of red meat and whole-fat dairy.  If you have high blood pressure, you may need to limit your sodium intake or follow a diet such as the DASH eating plan. DASH is an eating plan that aims to lower high blood pressure.  What foods are recommended? The items listed below may not be a complete list. Talk with your dietitian about what dietary choices are best for you. Grains Whole grains, such as whole-wheat or whole-grain breads, crackers, cereals, and pasta. Unsweetened oatmeal. Bulgur. Barley. Quinoa. Brown rice. Corn or whole-wheat flour tortillas or taco shells. Vegetables Lettuce. Spinach. Peas. Beets. Cauliflower. Cabbage. Broccoli. Carrots. Tomatoes. Squash. Eggplant. Herbs. Peppers. Onions. Cucumbers. Brussels sprouts. Fruits Berries. Bananas. Apples. Oranges. Grapes. Papaya. Mango. Pomegranate. Kiwi. Grapefruit. Cherries. Meats and other protein foods Seafood. Poultry without skin. Lean cuts of pork and beef. Tofu. Eggs. Nuts. Beans. Dairy Low-fat or fat-free dairy products, such as yogurt, cottage cheese, and cheese. Beverages Water. Tea. Coffee. Sugar-free or diet soda. Seltzer water. Lowfat or no-fat milk. Milk alternatives, such as soy or almond milk. Fats and oils Olive oil. Canola oil. Sunflower oil. Grapeseed oil. Avocado. Walnuts. Sweets and desserts Sugar-free or low-fat pudding. Sugar-free or  low-fat ice cream and other frozen treats. Seasoning and other foods Herbs. Sodium-free spices. Mustard. Relish. Low-fat, low-sugar ketchup. Low-fat, low-sugar barbecue sauce. Low-fat or fat-free mayonnaise. What foods are not recommended? The items listed below may not be a complete list. Talk with your dietitian about what dietary choices are best for you. Grains Refined white flour and flour products, such as bread, pasta, snack foods, and cereals. Vegetables Canned vegetables. Frozen vegetables with butter or cream sauce. Fruits Fruits canned with syrup. Meats and other protein foods Fatty cuts of meat. Poultry with skin. Breaded or fried meat. Processed meats. Dairy Full-fat yogurt, cheese, or milk. Beverages Sweetened drinks, such as sweet iced tea and soda. Fats and oils Butter. Lard. Ghee. Sweets and desserts Baked goods, such as cake, cupcakes, pastries, cookies, and cheesecake. Seasoning and other foods Spice mixes with added salt. Ketchup. Barbecue sauce. Mayonnaise. Summary  To prevent diabetes from developing, you may need to make diet and other lifestyle changes to help control blood sugar, improve cholesterol levels, and manage your blood pressure.  Set weight loss goals with the help of your health care team. It is recommended that most people with prediabetes lose 7 percent of their current body weight.  Consider following a Mediterranean diet that includes plenty of fresh fruits and vegetables, whole grains, beans, nuts, seeds, fish, lean meat, low-fat dairy, and healthy oils. This information is not intended to replace advice given to you by your health care provider. Make sure you discuss any questions you have with your health care provider. Document Revised: 04/10/2019 Document Reviewed: 02/20/2017 Elsevier Patient Education  [place].  Tension Headache, Adult A tension headache is a feeling of pain, pressure, or aching in the head that is often felt  over the front and sides of the head. The pain can be dull, or it can feel tight (constricting). There are two types of tension headache:  Episodic tension headache. This is when the headaches happen fewer than 15 days a month.  Chronic tension headache. This is when the headaches happen more than 15 days a month during a 9-month period. A tension headache can last from 30 minutes to several days. It is the most common kind of headache. Tension headaches are not normally associated with nausea or vomiting, and they do not get worse with physical activity. What are the causes? The exact cause of this condition is not known. Tension headaches are often triggered by stress, anxiety, or depression. Other triggers include:  Alcohol.  Too much caffeine or caffeine withdrawal.  Respiratory infections,  such as colds, flu, or sinus infections.  Dental problems or teeth clenching.  Tiredness (fatigue).  Holding your head and neck in the same position for a long period of time, such as while using a computer.  Smoking.  Arthritis of the neck. What are the signs or symptoms? Symptoms of this condition include:  A feeling of pressure or tightness around the head.  Dull, aching head pain.  Pain over the front and sides of the head.  Tenderness in the muscles of the head, neck, and shoulders. How is this diagnosed? This condition may be diagnosed based on your symptoms, your medical history, and a physical exam. If your symptoms are severe or unusual, you may have imaging tests, such as a CT scan or an MRI of your head. Your vision may also be checked. How is this treated? This condition may be treated with lifestyle changes and with medicines that help relieve symptoms. Follow these instructions at home: Managing pain  Take over-the-counter and prescription medicines only as told by your health care provider.  When you have a headache, lie down in a dark, quiet room.  If directed,  apply ice to the head and neck: ? Put ice in a plastic bag. ? Place a towel between your skin and the bag. ? Leave the ice on for 20 minutes, 2-3 times a day.  If directed, apply heat to the back of your neck as often as told by your health care provider. Use the heat source that your health care provider recommends, such as a moist heat pack or a heating pad. ? Place a towel between your skin and the heat source. ? Leave the heat on for 20-30 minutes. ? Remove the heat if your skin turns bright red. This is especially important if you are unable to feel pain, heat, or cold. You may have a greater risk of getting burned. Eating and drinking  Eat meals on a regular schedule.  Limit alcohol intake to no more than 1 drink a day for nonpregnant women and 2 drinks a day for men. One drink equals 12 oz of beer, 5 oz of wine, or 1 oz of hard liquor.  Drink enough fluid to keep your urine pale yellow.  Decrease your caffeine intake, or stop using caffeine. Lifestyle  Get 7-9 hours of sleep each night, or get the amount of sleep recommended by your health care provider.  At bedtime, remove all electronic devices from your room. Electronic devices include computers, phones, and tablets.  Find ways to manage your stress. Some things that can help relieve stress include: ? Exercise. ? Deep breathing exercises. ? Yoga. ? Listening to music. ? Positive mental imagery.  Try to sit up straight and avoid tensing your muscles.  Do not use any products that contain nicotine or tobacco, such as cigarettes and e-cigarettes. If you need help quitting, ask your health care provider. General instructions   Keep all follow-up visits as told by your health care provider. This is important.  Avoid any headache triggers. Keep a headache journal to help find out what may trigger your headaches. For example, write down: ? What you eat and drink. ? How much sleep you get. ? Any change to your diet or  medicines. Contact a health care provider if:  Your headache does not get better.  Your headache comes back.  You are sensitive to sounds, light, or smells because of a headache.  You have nausea or you vomit.  Your stomach hurts. Get help right away if:  You suddenly develop a very severe headache along with any of the following: ? A stiff neck. ? Nausea and vomiting. ? Confusion. ? Weakness. ? Double vision or loss of vision. ? Shortness of breath. ? Rash. ? Unusual sleepiness. ? Fever. ? Trouble speaking. ? Pain in your eyes or ears. ? Trouble walking or balancing. ? Feeling faint or passing out. Summary  A tension headache is a feeling of pain, pressure, or aching in the head that is often felt over the front and sides of the head.  A tension headache can last from 30 minutes to several days. It is the most common kind of headache.  This condition may be diagnosed based on your symptoms, your medical history, and a physical exam.  This condition may be treated with lifestyle changes and with medicines that help relieve symptoms. This information is not intended to replace advice given to you by your health care provider. Make sure you discuss any questions you have with your health care provider. Document Revised: 10/14/2019 Document Reviewed: 03/29/2017 Elsevier Patient Education  [place].

## 2020-06-18 LAB — COMPREHENSIVE METABOLIC PANEL
AG Ratio: 1.8 (calc) (ref 1.0–2.5)
ALT: 28 U/L (ref 6–29)
AST: 18 U/L (ref 10–30)
Albumin: 4.4 g/dL (ref 3.6–5.1)
Alkaline phosphatase (APISO): 66 U/L (ref 31–125)
BUN: 17 mg/dL (ref 7–25)
CO2: 22 mmol/L (ref 20–32)
Calcium: 9.8 mg/dL (ref 8.6–10.2)
Chloride: 106 mmol/L (ref 98–110)
Creat: 0.7 mg/dL (ref 0.50–1.10)
Globulin: 2.5 g/dL (calc) (ref 1.9–3.7)
Glucose, Bld: 111 mg/dL — ABNORMAL HIGH (ref 65–99)
Potassium: 3.8 mmol/L (ref 3.5–5.3)
Sodium: 138 mmol/L (ref 135–146)
Total Bilirubin: 0.3 mg/dL (ref 0.2–1.2)
Total Protein: 6.9 g/dL (ref 6.1–8.1)

## 2020-06-18 LAB — VITAMIN B12: Vitamin B-12: 458 pg/mL (ref 200–1100)

## 2020-06-18 LAB — TSH: TSH: 0.71 mIU/L

## 2020-06-29 MED FILL — PRENAISSANCE PLUS SOFTGEL: 28-1-250 | 90 days supply | Qty: 90 | Fill #1

## 2020-06-29 MED FILL — LEVOCETIRIZINE 5 MG TABLET: 5 | 90 days supply | Qty: 90 | Fill #2

## 2020-06-30 LAB — HM HEPATITIS C SCREENING LAB: HM Hepatitis Screen: NEGATIVE

## 2020-06-30 LAB — HIV ANTIBODY (ROUTINE TESTING W REFLEX): HIV 1&2 Ab, 4th Generation: NEGATIVE

## 2020-07-05 LAB — OB RESULTS CONSOLE HEPATITIS B SURFACE ANTIGEN: Hepatitis B Surface Ag: NEGATIVE

## 2020-07-05 LAB — OB RESULTS CONSOLE RUBELLA ANTIBODY, IGM: Rubella: IMMUNE

## 2020-07-05 LAB — OB RESULTS CONSOLE HIV ANTIBODY (ROUTINE TESTING): HIV: NONREACTIVE

## 2020-07-06 LAB — OB RESULTS CONSOLE GC/CHLAMYDIA
Chlamydia: NEGATIVE
Gonorrhea: NEGATIVE

## 2020-09-14 ENCOUNTER — Other Ambulatory Visit: Payer: Self-pay

## 2020-09-14 ENCOUNTER — Encounter: Payer: No Typology Code available for payment source | Attending: Obstetrics & Gynecology | Admitting: Registered"

## 2020-09-14 DIAGNOSIS — O99810 Abnormal glucose complicating pregnancy: Secondary | ICD-10-CM | POA: Diagnosis not present

## 2020-09-16 ENCOUNTER — Other Ambulatory Visit (HOSPITAL_COMMUNITY): Payer: Self-pay | Admitting: Obstetrics & Gynecology

## 2020-09-16 ENCOUNTER — Encounter: Payer: Self-pay | Admitting: Registered"

## 2020-09-16 MED FILL — FREESTYLE LITE TEST STRIP: 25 days supply | Qty: 100 | Fill #0

## 2020-09-16 NOTE — Progress Notes (Signed)
Patient was seen on 09/14/20 for Gestational Diabetes self-management class at the [facility]. The following learning objectives were met by the patient during this course:   States the definition of Gestational Diabetes  States why dietary management is important in controlling blood glucose  Describes the effects each nutrient has on blood glucose levels  Demonstrates ability to create a balanced meal plan  Demonstrates carbohydrate counting   States when to check blood glucose levels  Demonstrates proper blood glucose monitoring techniques  States the effect of stress and exercise on blood glucose levels  States the importance of limiting caffeine and abstaining from alcohol and smoking  Blood glucose monitor given: None Patient obtained glucometer prior to class.  Patient instructed to monitor glucose levels: FBS: 60 - <95; 1 hour: <140; 2 hour: <120  Patient received handouts:  Nutrition Diabetes and Pregnancy, including carb counting list  Patient will be seen for follow-up as needed. 

## 2020-09-19 MED FILL — FREESTYLE LITE METER: W/DEVICE | 30 days supply | Qty: 1 | Fill #0

## 2020-09-26 MED FILL — LEVOCETIRIZINE 5 MG TABLET: 5 | 90 days supply | Qty: 90 | Fill #3

## 2020-09-26 MED FILL — PRENAISSANCE PLUS SOFTGEL: 28-1-250 | 90 days supply | Qty: 90 | Fill #2

## 2020-10-06 ENCOUNTER — Other Ambulatory Visit (HOSPITAL_COMMUNITY): Payer: Self-pay | Admitting: Obstetrics & Gynecology

## 2020-10-06 MED FILL — FREESTYLE LANCETS: 25 days supply | Qty: 100 | Fill #0

## 2020-10-13 MED FILL — FREESTYLE LITE TEST STRIP: 25 days supply | Qty: 100 | Fill #1

## 2020-11-01 ENCOUNTER — Other Ambulatory Visit (HOSPITAL_COMMUNITY): Payer: Self-pay | Admitting: Obstetrics and Gynecology

## 2020-11-01 MED FILL — glyBURIDE 2.5 MG TABS: 2.5 | 90 days supply | Qty: 90 | Fill #0

## 2020-11-09 MED FILL — FREESTYLE LITE TEST STRIP: 25 days supply | Qty: 100 | Fill #2

## 2020-11-21 ENCOUNTER — Other Ambulatory Visit (HOSPITAL_COMMUNITY): Payer: Self-pay | Admitting: Otolaryngology

## 2020-11-21 MED FILL — GENTAMICIN 0.1% OINTMENT: 0.1 | 30 days supply | Qty: 30 | Fill #0

## 2020-12-02 MED FILL — FREESTYLE LITE TEST STRIP: 25 days supply | Qty: 100 | Fill #3

## 2020-12-05 MED FILL — FREESTYLE LANCETS: 25 days supply | Qty: 100 | Fill #1

## 2020-12-07 ENCOUNTER — Encounter: Payer: Self-pay | Admitting: Internal Medicine

## 2020-12-23 ENCOUNTER — Encounter (HOSPITAL_COMMUNITY): Payer: Self-pay | Admitting: Obstetrics and Gynecology

## 2020-12-23 ENCOUNTER — Inpatient Hospital Stay (HOSPITAL_COMMUNITY)
Admission: AD | Admit: 2020-12-23 | Discharge: 2020-12-23 | Disposition: A | Discharge status: Home / Self Care | Payer: No Typology Code available for payment source | Attending: Obstetrics and Gynecology | Admitting: Obstetrics and Gynecology

## 2020-12-23 ENCOUNTER — Other Ambulatory Visit: Payer: Self-pay

## 2020-12-23 DIAGNOSIS — Z3A36 36 weeks gestation of pregnancy: Secondary | ICD-10-CM | POA: Insufficient documentation

## 2020-12-23 DIAGNOSIS — O24419 Gestational diabetes mellitus in pregnancy, unspecified control: Secondary | ICD-10-CM | POA: Diagnosis present

## 2020-12-23 NOTE — MAU Note (Signed)
Office closed for holidays.  Here for NST only.  Pt getting them twice a week for GDM.  No complaints

## 2020-12-27 ENCOUNTER — Other Ambulatory Visit (HOSPITAL_COMMUNITY): Payer: Self-pay | Admitting: Obstetrics and Gynecology

## 2020-12-27 MED FILL — FREESTYLE LANCETS: 25 days supply | Qty: 100 | Fill #2

## 2020-12-27 MED FILL — FREESTYLE LITE TEST STRIP: 25 days supply | Qty: 100 | Fill #4

## 2020-12-27 MED FILL — PRENAISSANCE PLUS SOFTGEL: 28-1-250 | 90 days supply | Qty: 90 | Fill #3

## 2020-12-28 ENCOUNTER — Other Ambulatory Visit (HOSPITAL_COMMUNITY): Payer: Self-pay | Admitting: Otolaryngology

## 2020-12-28 MED FILL — LEVOCETIRIZINE 5 MG TABLET: 5 | 90 days supply | Qty: 90 | Fill #0

## 2020-12-29 MED FILL — glyBURIDE 2.5 MG TABS: 2.5 | 60 days supply | Qty: 180 | Fill #0

## 2020-12-30 ENCOUNTER — Encounter (HOSPITAL_COMMUNITY): Payer: Self-pay | Admitting: Obstetrics and Gynecology

## 2020-12-30 ENCOUNTER — Other Ambulatory Visit: Payer: Self-pay

## 2020-12-30 ENCOUNTER — Inpatient Hospital Stay (HOSPITAL_COMMUNITY)
Admission: AD | Admit: 2020-12-30 | Discharge: 2020-12-30 | Disposition: A | Discharge status: Home / Self Care | Payer: No Typology Code available for payment source | Attending: Obstetrics and Gynecology | Admitting: Obstetrics and Gynecology

## 2020-12-30 DIAGNOSIS — Z3689 Encounter for other specified antenatal screening: Secondary | ICD-10-CM | POA: Insufficient documentation

## 2020-12-30 DIAGNOSIS — Z3A35 35 weeks gestation of pregnancy: Secondary | ICD-10-CM | POA: Diagnosis not present

## 2020-12-30 NOTE — MAU Provider Note (Signed)
Event Date/Time   First Provider Initiated Contact with Patient 12/30/20 1007     S Ms. Kimberly Cortez is a 34 y.o. G45P1001 pregnant female with GDM at [redacted]w[redacted]d who presents to MAU today for NST (sent by [facility] since their office is closed). She endorses fetal movement, states "I feel good" and denies any contractions, cramping, LOF, VB or pain.  O BP 127/76   Pulse (!) 105   Temp 98.2 F (36.8 C)   Resp 17   LMP 04/24/2020  Physical Exam Vitals and nursing note reviewed.  Constitutional:      General: She is not in acute distress.    Appearance: Normal appearance. She is normal weight. She is not ill-appearing.  Eyes:     Pupils: Pupils are equal, round, and reactive to light.  Cardiovascular:     Rate and Rhythm: Normal rate.     Pulses: Normal pulses.  Pulmonary:     Effort: Pulmonary effort is normal.  Musculoskeletal:        General: Normal range of motion.  Skin:    General: Skin is warm.     Capillary Refill: Capillary refill takes less than 2 seconds.  Neurological:     Mental Status: She is alert and oriented to person, place, and time.  Psychiatric:        Mood and Affect: Mood normal.        Behavior: Behavior normal.        Thought Content: Thought content normal.        Judgment: Judgment normal.    A NST reactive Medical screening exam complete  P Discharge from MAU with preterm labor precautions  Follow up as scheduled with [facility] Warning signs for worsening condition that would warrant emergency follow-up discussed Patient may return to MAU as needed for emergent OB/GYN related complaints  Bernerd Limbo, CNM 12/30/2020 10:45 AM

## 2020-12-30 NOTE — Discharge Instructions (Signed)
Fetal Movement Counts Patient Name: ________________________________________________ Patient Due Date: ____________________ What is a fetal movement count?  A fetal movement count is the number of times that you feel your baby move during a certain amount of time. This may also be called a fetal kick count. A fetal movement count is recommended for every pregnant woman. You may be asked to start counting fetal movements as early as week 28 of your pregnancy. Pay attention to when your baby is most active. You may notice your baby's sleep and wake cycles. You may also notice things that make your baby move more. You should do a fetal movement count:  When your baby is normally most active.  At the same time each day. A good time to count movements is while you are resting, after having something to eat and drink. How do I count fetal movements? 1. Find a quiet, comfortable area. Sit, or lie down on your side. 2. Write down the date, the start time and stop time, and the number of movements that you felt between those two times. Take this information with you to your health care visits. 3. Write down your start time when you feel the first movement. 4. Count kicks, flutters, swishes, rolls, and jabs. You should feel at least 10 movements. 5. You may stop counting after you have felt 10 movements, or if you have been counting for 2 hours. Write down the stop time. 6. If you do not feel 10 movements in 2 hours, contact your health care provider for further instructions. Your health care provider may want to do additional tests to assess your baby's well-being. Contact a health care provider if:  You feel fewer than 10 movements in 2 hours.  Your baby is not moving like he or she usually does. Date: ____________ Start time: ____________ Stop time: ____________ Movements: ____________ Date: ____________ Start time: ____________ Stop time: ____________ Movements: ____________ Date: ____________  Start time: ____________ Stop time: ____________ Movements: ____________ Date: ____________ Start time: ____________ Stop time: ____________ Movements: ____________ Date: ____________ Start time: ____________ Stop time: ____________ Movements: ____________ Date: ____________ Start time: ____________ Stop time: ____________ Movements: ____________ Date: ____________ Start time: ____________ Stop time: ____________ Movements: ____________ Date: ____________ Start time: ____________ Stop time: ____________ Movements: ____________ Date: ____________ Start time: ____________ Stop time: ____________ Movements: ____________ This information is not intended to replace advice given to you by your health care provider. Make sure you discuss any questions you have with your health care provider. Document Revised: 08/06/2019 Document Reviewed: 08/06/2019 Elsevier Patient Education  2020 Elsevier Inc.  

## 2020-12-30 NOTE — MAU Note (Signed)
Here for NST for GDM.  Denies any other issues. Endorses + FM

## 2020-12-31 NOTE — L&D Delivery Note (Signed)
Delivery Note At 12:32 PM a viable female was delivered via OA Presentation .  APGAR: 9 9  weight pending .   Placenta status: spontaneously with 3 vessel cord ,  .  Cord:   with the following complications:  none.  Cord pH: not obtained  Anesthesia:  epidural Episiotomy:  no Lacerations: wnd  Suture Repair: 3.0 chromic Est. Blood Loss (mL):  400  Mom to postpartum.  Baby to Couplet care / Skin to Skin.  Cyril Mourning 01/23/2021, 12:50 PM

## 2021-01-06 ENCOUNTER — Encounter: Payer: Self-pay | Admitting: Internal Medicine

## 2021-01-06 ENCOUNTER — Ambulatory Visit (INDEPENDENT_AMBULATORY_CARE_PROVIDER_SITE_OTHER): Payer: No Typology Code available for payment source | Admitting: Internal Medicine

## 2021-01-06 ENCOUNTER — Other Ambulatory Visit: Payer: Self-pay

## 2021-01-06 VITALS — BP 118/74 | HR 102 | Temp 98.0°F | Ht 62.6 in | Wt 190.0 lb

## 2021-01-06 DIAGNOSIS — Z1389 Encounter for screening for other disorder: Secondary | ICD-10-CM

## 2021-01-06 DIAGNOSIS — O24419 Gestational diabetes mellitus in pregnancy, unspecified control: Secondary | ICD-10-CM

## 2021-01-06 DIAGNOSIS — J309 Allergic rhinitis, unspecified: Secondary | ICD-10-CM

## 2021-01-06 DIAGNOSIS — Z1283 Encounter for screening for malignant neoplasm of skin: Secondary | ICD-10-CM

## 2021-01-06 DIAGNOSIS — R5383 Other fatigue: Secondary | ICD-10-CM

## 2021-01-06 DIAGNOSIS — Z0000 Encounter for general adult medical examination without abnormal findings: Secondary | ICD-10-CM | POA: Diagnosis not present

## 2021-01-06 DIAGNOSIS — E559 Vitamin D deficiency, unspecified: Secondary | ICD-10-CM

## 2021-01-06 DIAGNOSIS — L918 Other hypertrophic disorders of the skin: Secondary | ICD-10-CM

## 2021-01-06 LAB — CBC WITH DIFFERENTIAL/PLATELET
Basophils Absolute: 57 cells/uL (ref 0–200)
HCT: 32.2 % — ABNORMAL LOW (ref 35.0–45.0)
Neutrophils Relative %: 75 %
Platelets: 245 10*3/uL (ref 140–400)
RDW: 13.8 % (ref 11.0–15.0)

## 2021-01-06 LAB — COMPREHENSIVE METABOLIC PANEL
ALT: 13 U/L (ref 6–29)
Albumin: 3.5 g/dL — ABNORMAL LOW (ref 3.6–5.1)
BUN: 11 mg/dL (ref 7–25)
CO2: 22 mmol/L (ref 20–32)
Glucose, Bld: 70 mg/dL (ref 65–99)

## 2021-01-06 NOTE — Progress Notes (Signed)
Chief Complaint  Patient presents with  . Annual Exam   Annual  1. Skin tags new lesion left shoulder wants referral derm  2. ENT Dr. Pryor Ochoa wants referral to ent for focus plan  3. Gestational DM2 on diabeta 2.5 mg bid cbgs <150 .   Review of Systems  Constitutional: Negative for weight loss.  HENT: Negative for hearing loss.   Eyes: Negative for blurred vision.  Respiratory: Negative for shortness of breath.   Cardiovascular: Negative for chest pain.  Gastrointestinal: Negative for abdominal pain.  Musculoskeletal: Negative for falls and joint pain.  Skin: Negative for rash.  Neurological: Negative for headaches.  Psychiatric/Behavioral: Negative for depression.   Past Medical History:  Diagnosis Date  . Allergy   . Chlamydia   . GERD (gastroesophageal reflux disease)   . Gestational diabetes    x 2   . Miscarriage    10/15/19   Past Surgical History:  Procedure Laterality Date  . COLONOSCOPY WITH PROPOFOL N/A 11/26/2018   Procedure: COLONOSCOPY WITH PROPOFOL;  Surgeon: Jonathon Bellows, MD;  Location: [facility] ENDOSCOPY;  Service: Gastroenterology;  Laterality: N/A;  . ESOPHAGOGASTRODUODENOSCOPY (EGD) WITH PROPOFOL N/A 11/26/2018   Procedure: ESOPHAGOGASTRODUODENOSCOPY (EGD) WITH PROPOFOL;  Surgeon: Jonathon Bellows, MD;  Location: [facility] ENDOSCOPY;  Service: Gastroenterology;  Laterality: N/A;  . NO PAST SURGERIES    . WISDOM TOOTH EXTRACTION     Family History  Problem Relation Age of Onset  . Gout Mother   . Hypertension Mother   . Hypertension Father   . Diabetes Father   . Gestational diabetes Sister   . Cancer Other        breast   . Hypertension Brother   . Diabetes Maternal Grandmother   . Early death Paternal Grandfather    Social History   Socioeconomic History  . Marital status: Married    Spouse name: Not on file  . Number of children: Not on file  . Years of education: Not on file  . Highest education level: Not on file  Occupational History  . Not on  file  Tobacco Use  . Smoking status: Never Smoker  . Smokeless tobacco: Never Used  Vaping Use  . Vaping Use: Never used  Substance and Sexual Activity  . Alcohol use: Yes    Comment: none last 24hrs  . Drug use: No  . Sexual activity: Yes  Other Topics Concern  . 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 Worried About Charity fundraiser in the Last Year: Never true  . Ran Out of Food in the Last Year: Never true  Transportation Needs: Not on file  Physical Activity: Not on file  Stress: Not on file  Social Connections: Not on file  Intimate Partner Violence: Not on file   Current Meds  Medication Sig  . Cholecalciferol (VITAMIN D3) 25 MCG (1000 UT) CAPS Take 1,000 Units by mouth daily.  Marland Kitchen glucose blood test strip Daily Use as instructed. One touch verio flex  . glyBURIDE (DIABETA) 2.5 MG tablet Take 2.5 mg by mouth in the morning and at bedtime.  . Lancets (ONETOUCH ULTRASOFT) lancets Qd Use as instructed one touch  verio flex  . levocetirizine (XYZAL) 5 MG tablet Take 5 mg by mouth every evening.  . Prenatal MV-Min-FA-Omega-3 (PRENATAL GUMMIES/DHA & FA PO) Take by mouth.   Allergies  Allergen Reactions  . Other Itching    Shellfish caused itching hands with touching but still eats shellfish   . Shellfish Allergy Itching   No results found for this or any previous visit (from the past 2160 hour(s)). Objective  Body mass index is 34.09 kg/m. Wt Readings from Last 3 Encounters:  01/06/21 190 lb (86.2 kg)  06/17/20 175 lb 12.8 oz (79.7 kg)  12/04/19 166 lb (75.3 kg)   Temp Readings from Last 3 Encounters:  01/06/21 98 F (36.7 C) (Oral)  12/30/20 98.2 F (36.8 C)  12/23/20 98.4 F (36.9 C) (Oral)   BP Readings  from Last 3 Encounters:  01/06/21 118/74  12/30/20 127/76  12/23/20 131/86   Pulse Readings from Last 3 Encounters:  01/06/21 (!) 102  12/30/20 (!) 105  12/23/20 (!) 105    Physical Exam Vitals and nursing note reviewed.  Constitutional:      Appearance: Normal appearance. She is well-developed and well-groomed. She is obese.  HENT:     Head: Normocephalic and atraumatic.  Eyes:     Conjunctiva/sclera: Conjunctivae normal.     Pupils: Pupils are equal, round, and reactive to light.  Cardiovascular:     Rate and Rhythm: Normal rate and regular rhythm.     Heart sounds: Normal heart sounds. No murmur heard.   Pulmonary:     Effort: Pulmonary effort is normal.     Breath sounds: Normal breath sounds.  Abdominal:     Tenderness: There is no abdominal tenderness.  Skin:    General: Skin is warm and dry.  Neurological:     General: No focal deficit present.     Mental Status: She is alert and oriented to person, place, and time. Mental status is at baseline.     Gait: Gait normal.  Psychiatric:        Attention and Perception: Attention and perception normal.        Mood and Affect: Mood and affect normal.        Speech: Speech normal.        Behavior: Behavior normal. Behavior is cooperative.        Thought Content: Thought content normal.        Cognition and Memory: Cognition and memory normal.        Judgment: Judgment normal.     Assessment  Plan  Annual physical exam - Tdap utd,flu shot 09/2020 covid pfizer 3/3 Pap utd [facility] OB/GYNpap due 11/2020 megan morris Cont healthy diet and exercise  Gestational diabetes mellitus (GDM) in third trimester, gestational diabetes method of control unspecified - Plan: Hemoglobin A1c 2nd time having GDM   Fatigue, unspecified type - Plan: Comprehensive metabolic panel, CBC with Differential/Platelet, Iron, TIBC and Ferritin Panel, TSH, Vitamin D (25 hydroxy)  Vitamin D deficiency - Plan: Vitamin D (25  hydroxy)  Allergic rhinitis, unspecified seasonality, unspecified trigger -  Plan: Ambulatory referral to ENT   Skin tags, multiple acquired - Plan: Ambulatory referral to Dermatology      Provider: Dr. Olivia Mackie McLean-Scocuzza-Internal Medicine

## 2021-01-06 NOTE — Patient Instructions (Signed)
F/u 1 year annual take care

## 2021-01-07 LAB — LIPID PANEL
Cholesterol: 153 mg/dL (ref <200)
HDL: 62 mg/dL (ref >=50)
LDL Cholesterol (Calc): 68 mg/dL (calc)
Non-HDL Cholesterol (Calc): 91 mg/dL (calc) (ref <130)
Total CHOL/HDL Ratio: 2.5 (calc) (ref <5.0)
Triglycerides: 149 mg/dL (ref <150)

## 2021-01-07 LAB — COMPREHENSIVE METABOLIC PANEL
AG Ratio: 1.3 (calc) (ref 1.0–2.5)
AST: 14 U/L (ref 10–30)
Alkaline phosphatase (APISO): 149 U/L — ABNORMAL HIGH (ref 31–125)
BUN/Creatinine Ratio: 26 (calc) — ABNORMAL HIGH (ref 6–22)
Calcium: 9.3 mg/dL (ref 8.6–10.2)
Chloride: 107 mmol/L (ref 98–110)
Creat: 0.43 mg/dL — ABNORMAL LOW (ref 0.50–1.10)
Globulin: 2.6 g/dL (calc) (ref 1.9–3.7)
Potassium: 3.8 mmol/L (ref 3.5–5.3)
Sodium: 137 mmol/L (ref 135–146)
Total Bilirubin: 0.3 mg/dL (ref 0.2–1.2)
Total Protein: 6.1 g/dL (ref 6.1–8.1)

## 2021-01-07 LAB — CBC WITH DIFFERENTIAL/PLATELET
Absolute Monocytes: 1278 cells/uL — ABNORMAL HIGH (ref 200–950)
Basophils Relative: 0.4 %
Eosinophils Absolute: 85 cells/uL (ref 15–500)
Eosinophils Relative: 0.6 %
Hemoglobin: 11 g/dL — ABNORMAL LOW (ref 11.7–15.5)
Lymphs Abs: 2130 cells/uL (ref 850–3900)
MCH: 28.5 pg (ref 27.0–33.0)
MCHC: 34.2 g/dL (ref 32.0–36.0)
MCV: 83.4 fL (ref 80.0–100.0)
MPV: 9 fL (ref 7.5–12.5)
Monocytes Relative: 9 %
Neutro Abs: 10650 cells/uL — ABNORMAL HIGH (ref 1500–7800)
RBC: 3.86 10*6/uL (ref 3.80–5.10)
Total Lymphocyte: 15 %
WBC: 14.2 10*3/uL — ABNORMAL HIGH (ref 3.8–10.8)

## 2021-01-07 LAB — VITAMIN D 25 HYDROXY (VIT D DEFICIENCY, FRACTURES): Vit D, 25-Hydroxy: 34 ng/mL (ref 30–100)

## 2021-01-07 LAB — IRON,TIBC AND FERRITIN PANEL
%SAT: 14 % (calc) — ABNORMAL LOW (ref 16–45)
Ferritin: 8 ng/mL — ABNORMAL LOW (ref 16–154)
Iron: 76 ug/dL (ref 40–190)
TIBC: 562 mcg/dL (calc) — ABNORMAL HIGH (ref 250–450)

## 2021-01-07 LAB — MICROSCOPIC EXAMINATION
Bacteria, UA: NONE SEEN
Casts: NONE SEEN /lpf
RBC, Urine: NONE SEEN /hpf (ref 0–2)
WBC, UA: NONE SEEN /hpf (ref 0–5)

## 2021-01-07 LAB — URINALYSIS, ROUTINE W REFLEX MICROSCOPIC
Bilirubin, UA: NEGATIVE
Glucose, UA: NEGATIVE
Ketones, UA: NEGATIVE
Nitrite, UA: NEGATIVE
Protein,UA: NEGATIVE
Specific Gravity, UA: 1.014 (ref 1.005–1.030)
Urobilinogen, Ur: 0.2 mg/dL (ref 0.2–1.0)
pH, UA: 7 (ref 5.0–7.5)

## 2021-01-07 LAB — HEMOGLOBIN A1C
Hgb A1c MFr Bld: 5.4 % of total Hgb (ref <5.7)
Mean Plasma Glucose: 108 mg/dL
eAG (mmol/L): 6 mmol/L

## 2021-01-07 LAB — TSH: TSH: 1 mIU/L

## 2021-01-09 LAB — OB RESULTS CONSOLE GBS: GBS: NEGATIVE

## 2021-01-10 ENCOUNTER — Telehealth (HOSPITAL_COMMUNITY): Payer: Self-pay | Admitting: *Deleted

## 2021-01-10 ENCOUNTER — Encounter (HOSPITAL_COMMUNITY): Payer: Self-pay | Admitting: *Deleted

## 2021-01-10 NOTE — Telephone Encounter (Signed)
Preadmission screen  

## 2021-01-20 ENCOUNTER — Other Ambulatory Visit (HOSPITAL_COMMUNITY): Payer: Self-pay | Admitting: Obstetrics & Gynecology

## 2021-01-20 ENCOUNTER — Other Ambulatory Visit: Payer: No Typology Code available for payment source

## 2021-01-20 ENCOUNTER — Other Ambulatory Visit (HOSPITAL_COMMUNITY)
Admission: RE | Admit: 2021-01-20 | Discharge: 2021-01-20 | Disposition: A | Discharge status: Home / Self Care | Payer: No Typology Code available for payment source | Source: Ambulatory Visit | Attending: Obstetrics and Gynecology | Admitting: Obstetrics and Gynecology

## 2021-01-20 DIAGNOSIS — Z20822 Contact with and (suspected) exposure to covid-19: Secondary | ICD-10-CM | POA: Insufficient documentation

## 2021-01-20 DIAGNOSIS — Z01812 Encounter for preprocedural laboratory examination: Secondary | ICD-10-CM | POA: Insufficient documentation

## 2021-01-20 LAB — SARS CORONAVIRUS 2 (TAT 6-24 HRS): SARS Coronavirus 2: NEGATIVE

## 2021-01-20 MED FILL — FREESTYLE LITE TEST STRIP: 25 days supply | Qty: 100 | Fill #0

## 2021-01-21 ENCOUNTER — Other Ambulatory Visit (HOSPITAL_COMMUNITY): Payer: No Typology Code available for payment source

## 2021-01-23 ENCOUNTER — Encounter (HOSPITAL_COMMUNITY): Payer: Self-pay | Admitting: Obstetrics and Gynecology

## 2021-01-23 ENCOUNTER — Inpatient Hospital Stay (HOSPITAL_COMMUNITY): Payer: No Typology Code available for payment source

## 2021-01-23 ENCOUNTER — Inpatient Hospital Stay (HOSPITAL_COMMUNITY)
Admission: RE | Admit: 2021-01-23 | Discharge: 2021-01-24 | DRG: 807 | Disposition: A | Discharge status: Home / Self Care | Payer: No Typology Code available for payment source | Attending: Obstetrics and Gynecology | Admitting: Obstetrics and Gynecology

## 2021-01-23 ENCOUNTER — Inpatient Hospital Stay (HOSPITAL_COMMUNITY): Payer: No Typology Code available for payment source | Admitting: Anesthesiology

## 2021-01-23 ENCOUNTER — Other Ambulatory Visit: Payer: Self-pay

## 2021-01-23 DIAGNOSIS — O24425 Gestational diabetes mellitus in childbirth, controlled by oral hypoglycemic drugs: Principal | ICD-10-CM | POA: Diagnosis present

## 2021-01-23 DIAGNOSIS — Z3A39 39 weeks gestation of pregnancy: Secondary | ICD-10-CM | POA: Diagnosis not present

## 2021-01-23 DIAGNOSIS — D500 Iron deficiency anemia secondary to blood loss (chronic): Secondary | ICD-10-CM | POA: Diagnosis not present

## 2021-01-23 DIAGNOSIS — O9081 Anemia of the puerperium: Secondary | ICD-10-CM | POA: Diagnosis not present

## 2021-01-23 DIAGNOSIS — Z20822 Contact with and (suspected) exposure to covid-19: Secondary | ICD-10-CM | POA: Diagnosis present

## 2021-01-23 LAB — CBC
HCT: 35.2 % — ABNORMAL LOW (ref 36.0–46.0)
Hemoglobin: 11.3 g/dL — ABNORMAL LOW (ref 12.0–15.0)
MCH: 27.7 pg (ref 26.0–34.0)
MCHC: 32.1 g/dL (ref 30.0–36.0)
MCV: 86.3 fL (ref 80.0–100.0)
Platelets: 244 10*3/uL (ref 150–400)
RBC: 4.08 MIL/uL (ref 3.87–5.11)
RDW: 14.3 % (ref 11.5–15.5)
WBC: 12.2 10*3/uL — ABNORMAL HIGH (ref 4.0–10.5)
nRBC: 0 % (ref 0.0–0.2)

## 2021-01-23 LAB — TYPE AND SCREEN
ABO/RH(D): O POS
Antibody Screen: NEGATIVE

## 2021-01-23 LAB — GLUCOSE, CAPILLARY
Glucose-Capillary: 86 mg/dL (ref 70–99)
Glucose-Capillary: 80 mg/dL (ref 70–99)

## 2021-01-23 LAB — RPR: RPR Ser Ql: NONREACTIVE

## 2021-01-23 MED ORDER — DIBUCAINE (PERIANAL) 1 % EX OINT: 1.0000 "application " | TOPICAL_OINTMENT | CUTANEOUS | Status: DC | PRN

## 2021-01-23 MED ORDER — MISOPROSTOL 25 MCG QUARTER TABLET
25.0000 ug | ORAL_TABLET | ORAL | Status: DC | PRN
  Administered 2021-01-23: 25 ug via VAGINAL
  Filled 2021-01-23: qty 1

## 2021-01-23 MED ORDER — ONDANSETRON HCL 4 MG PO TABS: 4.0000 mg | ORAL_TABLET | ORAL | Status: DC | PRN

## 2021-01-23 MED ORDER — EPHEDRINE 5 MG/ML INJ: 10.0000 mg | INTRAVENOUS | Status: DC | PRN

## 2021-01-23 MED ORDER — ONDANSETRON HCL 4 MG/2ML IJ SOLN: 4.0000 mg | INTRAMUSCULAR | Status: DC | PRN

## 2021-01-23 MED ORDER — PHENYLEPHRINE 40 MCG/ML (10ML) SYRINGE FOR IV PUSH (FOR BLOOD PRESSURE SUPPORT)
80.0000 ug | PREFILLED_SYRINGE | INTRAVENOUS | Status: DC | PRN
  Filled 2021-01-23: qty 10

## 2021-01-23 MED ORDER — TERBUTALINE SULFATE 1 MG/ML IJ SOLN: 0.2500 mg | Freq: Once | INTRAMUSCULAR | Status: DC | PRN

## 2021-01-23 MED ORDER — ZOLPIDEM TARTRATE 5 MG PO TABS: 5.0000 mg | ORAL_TABLET | Freq: Every evening | ORAL | Status: DC | PRN

## 2021-01-23 MED ORDER — TETANUS-DIPHTH-ACELL PERTUSSIS 5-2.5-18.5 LF-MCG/0.5 IM SUSY: 0.5000 mL | PREFILLED_SYRINGE | Freq: Once | INTRAMUSCULAR | Status: DC

## 2021-01-23 MED ORDER — OXYTOCIN BOLUS FROM INFUSION
333.0000 mL | Freq: Once | INTRAVENOUS | Status: AC
  Administered 2021-01-23: 333 mL via INTRAVENOUS

## 2021-01-23 MED ORDER — BISACODYL 10 MG RE SUPP: 10.0000 mg | Freq: Every day | RECTAL | Status: DC | PRN

## 2021-01-23 MED ORDER — LIDOCAINE HCL (PF) 1 % IJ SOLN: 30.0000 mL | INTRAMUSCULAR | Status: DC | PRN

## 2021-01-23 MED ORDER — DIPHENHYDRAMINE HCL 50 MG/ML IJ SOLN: 12.5000 mg | INTRAMUSCULAR | Status: DC | PRN

## 2021-01-23 MED ORDER — SENNOSIDES-DOCUSATE SODIUM 8.6-50 MG PO TABS
2.0000 | ORAL_TABLET | Freq: Every day | ORAL | Status: DC
  Administered 2021-01-24: 2 via ORAL
  Filled 2021-01-23: qty 2

## 2021-01-23 MED ORDER — ONDANSETRON HCL 4 MG/2ML IJ SOLN: 4.0000 mg | Freq: Four times a day (QID) | INTRAMUSCULAR | Status: DC | PRN

## 2021-01-23 MED ORDER — LACTATED RINGERS IV SOLN: INTRAVENOUS | Status: DC

## 2021-01-23 MED ORDER — ACETAMINOPHEN 325 MG PO TABS: 650.0000 mg | ORAL_TABLET | ORAL | Status: DC | PRN

## 2021-01-23 MED ORDER — MEDROXYPROGESTERONE ACETATE 150 MG/ML IM SUSP: 150.0000 mg | INTRAMUSCULAR | Status: DC | PRN

## 2021-01-23 MED ORDER — OXYCODONE HCL 5 MG PO TABS: 5.0000 mg | ORAL_TABLET | ORAL | Status: DC | PRN

## 2021-01-23 MED ORDER — PHENYLEPHRINE 40 MCG/ML (10ML) SYRINGE FOR IV PUSH (FOR BLOOD PRESSURE SUPPORT): 80.0000 ug | PREFILLED_SYRINGE | INTRAVENOUS | Status: DC | PRN

## 2021-01-23 MED ORDER — LACTATED RINGERS IV SOLN: 500.0000 mL | INTRAVENOUS | Status: DC | PRN

## 2021-01-23 MED ORDER — PRENATAL MULTIVITAMIN CH
1.0000 | ORAL_TABLET | Freq: Every day | ORAL | Status: DC
  Filled 2021-01-23: qty 1

## 2021-01-23 MED ORDER — OXYTOCIN-SODIUM CHLORIDE 30-0.9 UT/500ML-% IV SOLN: 2.5000 [IU]/h | INTRAVENOUS | Status: DC

## 2021-01-23 MED ORDER — OXYCODONE-ACETAMINOPHEN 5-325 MG PO TABS: 1.0000 | ORAL_TABLET | ORAL | Status: DC | PRN

## 2021-01-23 MED ORDER — SIMETHICONE 80 MG PO CHEW: 80.0000 mg | CHEWABLE_TABLET | ORAL | Status: DC | PRN

## 2021-01-23 MED ORDER — FENTANYL-BUPIVACAINE-NACL 0.5-0.125-0.9 MG/250ML-% EP SOLN
12.0000 mL/h | EPIDURAL | Status: DC | PRN
  Filled 2021-01-23: qty 250

## 2021-01-23 MED ORDER — FLEET ENEMA 7-19 GM/118ML RE ENEM: 1.0000 | ENEMA | Freq: Every day | RECTAL | Status: DC | PRN

## 2021-01-23 MED ORDER — IBUPROFEN 600 MG PO TABS
600.0000 mg | ORAL_TABLET | Freq: Four times a day (QID) | ORAL | Status: DC
  Administered 2021-01-23 – 2021-01-24 (×5): 600 mg via ORAL
  Filled 2021-01-23 (×6): qty 1

## 2021-01-23 MED ORDER — SODIUM CHLORIDE (PF) 0.9 % IJ SOLN
INTRAMUSCULAR | Status: DC | PRN
  Administered 2021-01-23: 12 mL/h via EPIDURAL

## 2021-01-23 MED ORDER — LIDOCAINE-EPINEPHRINE (PF) 2 %-1:200000 IJ SOLN
INTRAMUSCULAR | Status: DC | PRN
  Administered 2021-01-23 (×2): 5 mL via EPIDURAL

## 2021-01-23 MED ORDER — ACETAMINOPHEN 325 MG PO TABS
650.0000 mg | ORAL_TABLET | ORAL | Status: DC | PRN
  Administered 2021-01-24 (×2): 650 mg via ORAL
  Filled 2021-01-23 (×2): qty 2

## 2021-01-23 MED ORDER — BENZOCAINE-MENTHOL 20-0.5 % EX AERO
1.0000 "application " | INHALATION_SPRAY | CUTANEOUS | Status: DC | PRN
  Administered 2021-01-23: 1 via TOPICAL
  Filled 2021-01-23 (×2): qty 56

## 2021-01-23 MED ORDER — COCONUT OIL OIL: 1.0000 "application " | TOPICAL_OIL | Status: DC | PRN

## 2021-01-23 MED ORDER — OXYTOCIN-SODIUM CHLORIDE 30-0.9 UT/500ML-% IV SOLN
1.0000 m[IU]/min | INTRAVENOUS | Status: DC
  Administered 2021-01-23: 2 m[IU]/min via INTRAVENOUS
  Filled 2021-01-23: qty 500

## 2021-01-23 MED ORDER — OXYCODONE HCL 5 MG PO TABS: 10.0000 mg | ORAL_TABLET | ORAL | Status: DC | PRN

## 2021-01-23 MED ORDER — WITCH HAZEL-GLYCERIN EX PADS: 1.0000 "application " | MEDICATED_PAD | CUTANEOUS | Status: DC | PRN

## 2021-01-23 MED ORDER — OXYCODONE-ACETAMINOPHEN 5-325 MG PO TABS: 2.0000 | ORAL_TABLET | ORAL | Status: DC | PRN

## 2021-01-23 MED ORDER — MEASLES, MUMPS & RUBELLA VAC IJ SOLR: 0.5000 mL | Freq: Once | INTRAMUSCULAR | Status: DC

## 2021-01-23 MED ORDER — SOD CITRATE-CITRIC ACID 500-334 MG/5ML PO SOLN: 30.0000 mL | ORAL | Status: DC | PRN

## 2021-01-23 MED ORDER — DIPHENHYDRAMINE HCL 25 MG PO CAPS: 25.0000 mg | ORAL_CAPSULE | Freq: Four times a day (QID) | ORAL | Status: DC | PRN

## 2021-01-23 MED ORDER — LACTATED RINGERS IV SOLN: 500.0000 mL | Freq: Once | INTRAVENOUS | Status: DC

## 2021-01-23 NOTE — H&P (Signed)
Stevana Keerthi Hazell is a 35 y.o.G 3 P 1011 at 83 w 1 day presents for IOL for A2 DM Status post epidural and now on pitocin Blood sugars have been in normal range  OB History    Gravida  3   Para  1   Term  1   Preterm      AB  1   Living  1     SAB  1   IAB      Ectopic      Multiple  0   Live Births  1          Past Medical History:  Diagnosis Date  . Allergy   . Chlamydia   . GERD (gastroesophageal reflux disease)   . Gestational diabetes    x 2 glyburide  . Miscarriage    10/15/19   Past Surgical History:  Procedure Laterality Date  . COLONOSCOPY WITH PROPOFOL N/A 11/26/2018   Procedure: COLONOSCOPY WITH PROPOFOL;  Surgeon: Jonathon Bellows, MD;  Location: [facility] ENDOSCOPY;  Service: Gastroenterology;  Laterality: N/A;  . ESOPHAGOGASTRODUODENOSCOPY (EGD) WITH PROPOFOL N/A 11/26/2018   Procedure: ESOPHAGOGASTRODUODENOSCOPY (EGD) WITH PROPOFOL;  Surgeon: Jonathon Bellows, MD;  Location: [facility] ENDOSCOPY;  Service: Gastroenterology;  Laterality: N/A;  . NO PAST SURGERIES    . WISDOM TOOTH EXTRACTION     Family History: family history includes Cancer in an other family member; Diabetes in her father and maternal grandmother; Early death in her paternal grandfather; Gestational diabetes in her sister; Gout in her mother; Hypertension in her brother, father, and mother. Social History:  reports that she has never smoked. She has never used smokeless tobacco. She reports current alcohol use. She reports that she does not use drugs.     Maternal Diabetes: A2 DM Genetic Screening: Normal Maternal Ultrasounds/Referrals: Normal Fetal Ultrasounds or other Referrals:  None Maternal Substance Abuse:  No Significant Maternal Medications:  None Significant Maternal Lab Results:  Group B Strep negative Other Comments:  None  Review of Systems  All other systems reviewed and are negative.  Maternal Medical History:  Prenatal Complications - Diabetes: gestational. Diabetes is  managed by oral agent (monotherapy).      Dilation: 5 Effacement (%): 90 Station: -2 Exam by:: Dr Helane Rima Blood pressure 137/85, pulse 97, temperature 98.3 F (36.8 C), temperature source Oral, resp. rate 16, height 5' 2.5" (1.588 m), weight 86.7 kg, last menstrual period 04/24/2020, SpO2 98 %, currently breastfeeding. Maternal Exam:  Uterine Assessment: Contraction strength is moderate.  Contraction frequency is regular.   Abdomen: Fetal presentation: vertex     Fetal Exam Fetal State Assessment: Category I - tracings are normal.     Physical Exam Vitals and nursing note reviewed. Exam conducted with a chaperone present.  Constitutional:      Appearance: Normal appearance.  HENT:     Head: Normocephalic.  Cardiovascular:     Rate and Rhythm: Normal rate and regular rhythm.  Musculoskeletal:     Cervical back: Normal range of motion.  Neurological:     Mental Status: She is alert.     Prenatal labs: ABO, Rh: --/--/O POS (01/24 7846) Antibody: NEG (01/24 0042) Rubella: Immune (07/06 0000) RPR:    HBsAg: Negative (07/06 0000)  HIV: Non-reactive (07/06 0000)  GBS: Negative/-- (01/10 0000)   Results for orders placed or performed during the hospital encounter of 01/23/21 (from the past 24 hour(s))  Type and screen [place]     Status: None  Collection Time: 01/23/21 12:42 AM  Result Value Ref Range   ABO/RH(D) O POS    Antibody Screen NEG    Sample Expiration      01/26/2021,2359 Performed at [facility], [place] [address], [place], [place] [address]   CBC     Status: Abnormal   Collection Time: 01/23/21 12:51 AM  Result Value Ref Range   WBC 12.2 (H) 4.0 - 10.5 K/uL   RBC 4.08 3.87 - 5.11 MIL/uL   Hemoglobin 11.3 (L) 12.0 - 15.0 g/dL   HCT 35.2 (L) 36.0 - 46.0 %   MCV 86.3 80.0 - 100.0 fL   MCH 27.7 26.0 - 34.0 pg   MCHC 32.1 30.0 - 36.0 g/dL   RDW 14.3 11.5 - 15.5 %   Platelets 244 150 - 400 K/uL   nRBC 0.0 0.0 - 0.2 %   Glucose, capillary     Status: None   Collection Time: 01/23/21  1:23 AM  Result Value Ref Range   Glucose-Capillary 86 70 - 99 mg/dL  Glucose, capillary     Status: None   Collection Time: 01/23/21  6:24 AM  Result Value Ref Range   Glucose-Capillary 80 70 - 99 mg/dL    Assessment/Plan: IUP at 39 w 1 day A2 DM IOL Continue Pitocin Follow labor curve Anticipate NSVD   Cyril Mourning 01/23/2021, 8:33 AM

## 2021-01-23 NOTE — Anesthesia Procedure Notes (Signed)
Epidural Patient location during procedure: OB Start time: 01/23/2021 7:34 AM End time: 01/23/2021 7:47 AM  Staffing Anesthesiologist: Duane Boston, MD Performed: anesthesiologist   Preanesthetic Checklist Completed: patient identified, IV checked, site marked, risks and benefits discussed, monitors and equipment checked, pre-op evaluation and timeout performed  Epidural Patient position: sitting Prep: DuraPrep Patient monitoring: heart rate, cardiac monitor, continuous pulse ox and blood pressure Approach: midline Location: L2-L3 Injection technique: LOR saline  Needle:  Needle type: Tuohy  Needle gauge: 17 G Needle length: 9 cm Needle insertion depth: 6 cm Catheter size: 20 Guage Catheter at skin depth: 11 cm Test dose: negative and Other  Assessment Events: blood not aspirated, injection not painful, no injection resistance and negative IV test  Additional Notes Informed consent obtained prior to proceeding including risk of failure, 1% risk of PDPH, risk of minor discomfort and bruising.  Discussed rare but serious complications including epidural abscess, permanent nerve injury, epidural hematoma.  Discussed alternatives to epidural analgesia and patient desires to proceed.  Timeout performed pre-procedure verifying patient name, procedure, and platelet count.  Patient tolerated procedure well.

## 2021-01-23 NOTE — Anesthesia Preprocedure Evaluation (Signed)
Anesthesia Evaluation  Patient identified by MRN, date of birth, ID band Patient awake    Reviewed: Allergy & Precautions, H&P , NPO status , Patient's Chart, lab work & pertinent test results  Airway Mallampati: II  TM Distance: >3 FB Neck ROM: Full    Dental no notable dental hx. (+) Dental Advisory Given   Pulmonary neg pulmonary ROS,    Pulmonary exam normal        Cardiovascular negative cardio ROS Normal cardiovascular exam     Neuro/Psych negative neurological ROS  negative psych ROS   GI/Hepatic Neg liver ROS, GERD  ,  Endo/Other  diabetes  Renal/GU negative Renal ROS  negative genitourinary   Musculoskeletal negative musculoskeletal ROS (+)   Abdominal   Peds negative pediatric ROS (+)  Hematology negative hematology ROS (+)   Anesthesia Other Findings   Reproductive/Obstetrics (+) Pregnancy                             Anesthesia Physical Anesthesia Plan  ASA: II  Anesthesia Plan: Epidural   Post-op Pain Management:    Induction:   PONV Risk Score and Plan:   Airway Management Planned: Natural Airway  Additional Equipment:   Intra-op Plan:   Post-operative Plan:   Informed Consent: I have reviewed the patients History and Physical, chart, labs and discussed the procedure including the risks, benefits and alternatives for the proposed anesthesia with the patient or authorized representative who has indicated his/her understanding and acceptance.     Dental advisory given  Plan Discussed with: Anesthesiologist  Anesthesia Plan Comments:         Anesthesia Quick Evaluation

## 2021-01-24 ENCOUNTER — Encounter: Payer: Self-pay | Admitting: Internal Medicine

## 2021-01-24 ENCOUNTER — Other Ambulatory Visit (HOSPITAL_COMMUNITY): Payer: Self-pay | Admitting: Obstetrics and Gynecology

## 2021-01-24 DIAGNOSIS — Z6740 Type O blood, Rh positive: Secondary | ICD-10-CM | POA: Insufficient documentation

## 2021-01-24 LAB — CBC
HCT: 28.2 % — ABNORMAL LOW (ref 36.0–46.0)
Hemoglobin: 9.1 g/dL — ABNORMAL LOW (ref 12.0–15.0)
MCH: 27.7 pg (ref 26.0–34.0)
MCHC: 32.3 g/dL (ref 30.0–36.0)
MCV: 86 fL (ref 80.0–100.0)
Platelets: 197 10*3/uL (ref 150–400)
RBC: 3.28 MIL/uL — ABNORMAL LOW (ref 3.87–5.11)
RDW: 14.4 % (ref 11.5–15.5)
WBC: 17.4 10*3/uL — ABNORMAL HIGH (ref 4.0–10.5)
nRBC: 0 % (ref 0.0–0.2)

## 2021-01-24 LAB — RUBELLA SCREEN: Rubella: <0.9 index — ABNORMAL LOW (ref >0.99)

## 2021-01-24 MED ORDER — FERROUS SULFATE 325 (65 FE) MG PO TABS: 325.0000 mg | ORAL_TABLET | Freq: Every day | ORAL | 3 refills | Status: DC

## 2021-01-24 MED ORDER — IBUPROFEN 600 MG PO TABS: 600.0000 mg | ORAL_TABLET | Freq: Four times a day (QID) | ORAL | 0 refills | Status: DC

## 2021-01-24 MED ORDER — DOCUSATE SODIUM 100 MG PO CAPS: 100.0000 mg | ORAL_CAPSULE | Freq: Every day | ORAL | 0 refills | Status: DC

## 2021-01-24 MED ORDER — FERROUS SULFATE 325 (65 FE) MG PO TABS: 325.0000 mg | ORAL_TABLET | Freq: Every day | ORAL | Status: DC

## 2021-01-24 MED ORDER — ACETAMINOPHEN 325 MG PO TABS: 650.0000 mg | ORAL_TABLET | ORAL | 0 refills | Status: DC | PRN

## 2021-01-24 MED ORDER — DOCUSATE SODIUM 100 MG PO CAPS
100.0000 mg | ORAL_CAPSULE | Freq: Every day | ORAL | Status: DC
  Administered 2021-01-24: 100 mg via ORAL
  Filled 2021-01-24: qty 1

## 2021-01-24 MED FILL — IBUPROFEN 600 MG TABLET: 600 | 8 days supply | Qty: 30 | Fill #0

## 2021-01-24 NOTE — Anesthesia Postprocedure Evaluation (Signed)
Anesthesia Post Note  Patient: [name]  Procedure(s) Performed: AN AD HOC LABOR EPIDURAL     Patient location during evaluation: Mother Baby Anesthesia Type: Epidural Level of consciousness: awake and alert and oriented Pain management: satisfactory to patient Vital Signs Assessment: post-procedure vital signs reviewed and stable Respiratory status: spontaneous breathing and nonlabored ventilation Cardiovascular status: stable Postop Assessment: no headache, no backache, no signs of nausea or vomiting, adequate PO intake, patient able to bend at knees and able to ambulate (patient up walking) Anesthetic complications: no   No complications documented.  Last Vitals:  Vitals:   01/24/21 0117 01/24/21 0502  BP: 129/74 128/87  Pulse: 95 93  Resp: 16 18  Temp: 36.7 C 36.7 C  SpO2: 99% 97%    Last Pain:  Vitals:   01/24/21 0502  TempSrc: Oral  PainSc: 0-No pain   Pain Goal:                   Kimberly Cortez

## 2021-01-24 NOTE — Lactation Note (Signed)
This note was copied from a baby's chart. Lactation Consultation Note  Patient Name: Kimberly Cortez Date: 01/24/2021 Reason for consult: Follow-up assessment Age:36 hours  Mother plans to be discharged this afternoon. Mother request a harmony hand pump. She also was curious about the hospital charges for [facility] . Mother didn't want to ask lots of questions due to the charges.explained charges and advised mother not to worry about charges.  Mother was advised that we would go over any questions she had and may sure that we covered any concerns she had about breastfeeding.   Mother reports that she thinks that she may had a low milk supply last time. She supplemented with formula and pumped for one year. Mother was advised to begin pumping with DEBP when she gets home .  Suggested always breastfeeding first , encouraged to do good hand expression and breast massage before pumping.  Discussed treatment and prevention of engorgement.  Plan of Care : Breastfeed infant with feeding cues Supplement infant with ebm/formula, according to supplemental guidelines. Pump using a DEBP after each feeding for 15-20 mins.   Mother to continue to cue base feed infant and feed at least 8-12 times or more in 24 hours and advised to allow for cluster feeding infant as needed.  Mother to continue to due STS. Mother is aware of available LC services at [facility], BFSG'S, OP Dept, and phone # for questions or concerns about breastfeeding.  Mother receptive to all teaching and plan of care.    Maternal Data    Feeding Feeding Type: Breast Fed  LATCH Score                   Interventions Interventions: Hand pump  Lactation Tools Discussed/Used Pump Education: Setup, frequency, and cleaning;Milk Storage Initiated by:: Jess Barters RN,IBCLC Date initiated:: 01/24/21   Consult Status Consult Status: Complete    Darla Lesches 01/24/2021, 12:07 PM

## 2021-01-24 NOTE — Lactation Note (Addendum)
This note was copied from a baby's chart. Lactation Consultation Note  Patient Name: Kimberly Cortez [id number] Date: 01/24/2021 Reason for consult: Initial assessment;Term Age:36 hours P2, term female infant. Mom ask for [facility] services when infant was 12 hours of life. Infant had one stool and one void diaper that dad changed while LC was in the room. LC did not observe latch per parents, infant BF for one hour prior to [facility] entering the room. Mom's feeding choice at admission was breast and formula feeding. Mom was given her Personal DEBP due to being [employer]. Per mom, she feels infant is latching well, most feedings are 10 to 15 minutes in length, she is feeling a tug with latch no pain. Per mom, she BF her first child for one year who is currently 2 years 6 months. Mom doesn't have any questions or concerns for [facility] and she will call [place] services prn. Mom knows to BF infant according to cues, 8 to 12+ times within 24 hours, STS. Mom made aware of O/P services, breastfeeding support groups, community resources, and our phone # for post-discharge questions.   Maternal Data Formula Feeding for Exclusion: Yes Reason for exclusion: Mother's choice to formula and breast feed on admission Has patient been taught Hand Expression?: Yes Does the patient have breastfeeding experience prior to this delivery?: Yes  Feeding Feeding Type: Breast Fed  LATCH Score                   Interventions Interventions: Breast feeding basics reviewed;Skin to skin;Hand express  Lactation Tools Discussed/Used WIC Program: No   Consult Status Consult Status: PRN Follow-up type: In-patient    Vicente Serene 01/24/2021, 12:46 AM

## 2021-01-24 NOTE — Progress Notes (Signed)
Postpartum Progress Note  Post Partum Day 1 s/p spontaneous vaginal delivery.  Patient reports well-controlled pain, ambulating without difficulty, voiding spontaneously, tolerating PO.  Vaginal bleeding is appropriate.   Objective: Blood pressure 128/87, pulse 93, temperature 98 F (36.7 C), temperature source Oral, resp. rate 18, height 5' 2.5" (1.588 m), weight 86.7 kg, last menstrual period 04/24/2020, SpO2 97 %, unknown if currently breastfeeding.  Physical Exam:  General: alert and mild distress Lochia: appropriate Uterine Fundus: firm DVT Evaluation: No evidence of DVT seen on physical exam.  Recent Labs    01/23/21 0051 01/24/21 0531  HGB 11.3* 9.1*  HCT 35.2* 28.2*    Assessment/Plan: . Postpartum Day 1, s/p vaginal delivery. . Continue routine postpartum care . Anemia from delivery and PP blood loss.  Rx for Fe and colace.  . Lactation following . Anticipate discharge home today   LOS: 1 day   Carlyon Shadow 01/24/2021, 7:48 AM

## 2021-01-24 NOTE — Discharge Summary (Signed)
Obstetric Discharge Summary  Kimberly Cortez is a 35 y.o. female that presented on 01/23/2021 for IOL, A2GDM on glyburide.  She was admitted to labor and delivery for induction.  Her labor course was uncomplicated and she delivered a viable female infant on 01/23/2021.  Her postpartum course was uncomplicated and on PPD#1, she reported well controlled pain, spontaneous voiding, ambulating without difficulty, and tolerating PO. Blood glucose was well controlled during labor. She was stable for discharge home on 01/24/2021 with plans for in-office follow up.  Hemoglobin  Date Value Ref Range Status  01/24/2021 9.1 (L) 12.0 - 15.0 g/dL Final   HCT  Date Value Ref Range Status  01/24/2021 28.2 (L) 36.0 - 46.0 % Final    Physical Exam:  General: alert and no distress Lochia: appropriate Uterine Fundus: firm DVT Evaluation: No evidence of DVT seen on physical exam.  Discharge Diagnoses: Term Pregnancy-delivered  Discharge Information: Date: 01/24/2021 Activity: pelvic rest Diet: routine Medications: Ibuprofen and tylenol, iron and colace Condition: stable Instructions: refer to practice specific booklet Discharge to: home   Newborn Data: Live born female  Birth Weight: 7 lb 14.8 oz (3595 g) APGAR: 8, 9  Newborn Delivery   Birth date/time: 01/23/2021 12:32:00 Delivery type: Vaginal, Spontaneous      Home with mother.  Carlyon Shadow 01/24/2021, 3:27 PM

## 2021-02-01 ENCOUNTER — Inpatient Hospital Stay (HOSPITAL_COMMUNITY): Payer: No Typology Code available for payment source

## 2021-02-01 ENCOUNTER — Observation Stay (HOSPITAL_COMMUNITY)
Admission: AD | Admit: 2021-02-01 | Discharge: 2021-02-02 | Disposition: A | Discharge status: Home / Self Care | Payer: No Typology Code available for payment source | Attending: Obstetrics and Gynecology | Admitting: Obstetrics and Gynecology

## 2021-02-01 ENCOUNTER — Encounter (HOSPITAL_COMMUNITY): Payer: Self-pay | Admitting: Obstetrics and Gynecology

## 2021-02-01 ENCOUNTER — Other Ambulatory Visit: Payer: Self-pay

## 2021-02-01 DIAGNOSIS — O24435 Gestational diabetes mellitus in puerperium, controlled by oral hypoglycemic drugs: Secondary | ICD-10-CM | POA: Diagnosis not present

## 2021-02-01 DIAGNOSIS — R030 Elevated blood-pressure reading, without diagnosis of hypertension: Secondary | ICD-10-CM | POA: Diagnosis not present

## 2021-02-01 DIAGNOSIS — R7401 Elevation of levels of liver transaminase levels: Secondary | ICD-10-CM | POA: Diagnosis not present

## 2021-02-01 DIAGNOSIS — Z7984 Long term (current) use of oral hypoglycemic drugs: Secondary | ICD-10-CM | POA: Diagnosis not present

## 2021-02-01 DIAGNOSIS — O99893 Other specified diseases and conditions complicating puerperium: Secondary | ICD-10-CM | POA: Diagnosis not present

## 2021-02-01 DIAGNOSIS — Z20822 Contact with and (suspected) exposure to covid-19: Secondary | ICD-10-CM | POA: Insufficient documentation

## 2021-02-01 DIAGNOSIS — O2663 Liver and biliary tract disorders in the puerperium: Secondary | ICD-10-CM | POA: Insufficient documentation

## 2021-02-01 LAB — PREPARE RBC (CROSSMATCH)

## 2021-02-01 LAB — COMPREHENSIVE METABOLIC PANEL
ALT: 57 U/L — ABNORMAL HIGH (ref 0–44)
AST: 23 U/L (ref 15–41)
Albumin: 2.9 g/dL — ABNORMAL LOW (ref 3.5–5.0)
Alkaline Phosphatase: 92 U/L (ref 38–126)
Anion gap: 12 (ref 5–15)
BUN: 16 mg/dL (ref 6–20)
CO2: 23 mmol/L (ref 22–32)
Calcium: 8.1 mg/dL — ABNORMAL LOW (ref 8.9–10.3)
Chloride: 105 mmol/L (ref 98–111)
Creatinine, Ser: 0.69 mg/dL (ref 0.44–1.00)
GFR, Estimated: >60 mL/min (ref >60)
Glucose, Bld: 136 mg/dL — ABNORMAL HIGH (ref 70–99)
Potassium: 3.3 mmol/L — ABNORMAL LOW (ref 3.5–5.1)
Sodium: 140 mmol/L (ref 135–145)
Total Bilirubin: 0.5 mg/dL (ref 0.3–1.2)
Total Protein: 5.8 g/dL — ABNORMAL LOW (ref 6.5–8.1)

## 2021-02-01 LAB — HEMOGLOBIN AND HEMATOCRIT, BLOOD
HCT: 23.1 % — ABNORMAL LOW (ref 36.0–46.0)
HCT: 22.2 % — ABNORMAL LOW (ref 36.0–46.0)
Hemoglobin: 7.4 g/dL — ABNORMAL LOW (ref 12.0–15.0)
Hemoglobin: 7.4 g/dL — ABNORMAL LOW (ref 12.0–15.0)

## 2021-02-01 LAB — CBC
HCT: 28.9 % — ABNORMAL LOW (ref 36.0–46.0)
Hemoglobin: 9.6 g/dL — ABNORMAL LOW (ref 12.0–15.0)
MCH: 28.7 pg (ref 26.0–34.0)
MCHC: 33.2 g/dL (ref 30.0–36.0)
MCV: 86.5 fL (ref 80.0–100.0)
Platelets: 348 10*3/uL (ref 150–400)
RBC: 3.34 MIL/uL — ABNORMAL LOW (ref 3.87–5.11)
RDW: 13.7 % (ref 11.5–15.5)
WBC: 11 10*3/uL — ABNORMAL HIGH (ref 4.0–10.5)
nRBC: 0 % (ref 0.0–0.2)

## 2021-02-01 LAB — SARS CORONAVIRUS 2 BY RT PCR (HOSPITAL ORDER, PERFORMED IN ~~LOC~~ HOSPITAL LAB): SARS Coronavirus 2: NEGATIVE

## 2021-02-01 LAB — FIBRINOGEN: Fibrinogen: 272 mg/dL (ref 210–475)

## 2021-02-01 MED ORDER — IBUPROFEN 600 MG PO TABS
600.0000 mg | ORAL_TABLET | Freq: Four times a day (QID) | ORAL | Status: DC
  Administered 2021-02-02: 600 mg via ORAL
  Filled 2021-02-01 (×2): qty 1

## 2021-02-01 MED ORDER — HYDRALAZINE HCL 20 MG/ML IJ SOLN: 10.0000 mg | INTRAMUSCULAR | Status: DC | PRN

## 2021-02-01 MED ORDER — LABETALOL HCL 5 MG/ML IV SOLN: 80.0000 mg | INTRAVENOUS | Status: DC | PRN

## 2021-02-01 MED ORDER — ACETAMINOPHEN 325 MG PO TABS
650.0000 mg | ORAL_TABLET | ORAL | Status: DC | PRN
Start: 2021-02-01 — End: 2021-02-03
  Administered 2021-02-01: 650 mg via ORAL
  Filled 2021-02-01: qty 2

## 2021-02-01 MED ORDER — DOCUSATE SODIUM 100 MG PO CAPS
100.0000 mg | ORAL_CAPSULE | Freq: Every day | ORAL | Status: DC
  Administered 2021-02-02: 100 mg via ORAL
  Filled 2021-02-01: qty 1

## 2021-02-01 MED ORDER — LABETALOL HCL 5 MG/ML IV SOLN: 40.0000 mg | INTRAVENOUS | Status: DC | PRN

## 2021-02-01 MED ORDER — MISOPROSTOL 200 MCG PO TABS: 800.0000 ug | ORAL_TABLET | Freq: Once | ORAL | Status: AC

## 2021-02-01 MED ORDER — LABETALOL HCL 5 MG/ML IV SOLN: 20.0000 mg | INTRAVENOUS | Status: DC | PRN

## 2021-02-01 MED ORDER — METHYLERGONOVINE MALEATE 0.2 MG/ML IJ SOLN
0.2000 mg | Freq: Once | INTRAMUSCULAR | Status: AC
  Administered 2021-02-01: 0.2 mg via INTRAMUSCULAR
  Filled 2021-02-01: qty 1

## 2021-02-01 MED ORDER — FERROUS SULFATE 325 (65 FE) MG PO TABS
325.0000 mg | ORAL_TABLET | Freq: Every day | ORAL | Status: DC
Start: 2021-02-02 — End: 2021-02-03
  Administered 2021-02-02: 325 mg via ORAL
  Filled 2021-02-01: qty 1

## 2021-02-01 MED ORDER — SODIUM CHLORIDE 0.9% IV SOLUTION: Freq: Once | INTRAVENOUS | Status: AC

## 2021-02-01 MED ORDER — LACTATED RINGERS IV BOLUS
1000.0000 mL | Freq: Once | INTRAVENOUS | Status: AC
  Administered 2021-02-01: 1000 mL via INTRAVENOUS

## 2021-02-01 MED ORDER — MISOPROSTOL 200 MCG PO TABS
ORAL_TABLET | ORAL | Status: AC
  Administered 2021-02-01: 800 ug via RECTAL
  Filled 2021-02-01: qty 4

## 2021-02-01 NOTE — Progress Notes (Addendum)
Orthostatic BP:  52m laying: 123/75  18m stand: 129/110  17m stand: 112/67  * 90s in pt became pale and diaphoretic, reported feeling lightheaded, blurry vision, and weak legs. Pt had to lay back in bed right before 44m BP was taken.    [place] [facility]

## 2021-02-01 NOTE — MAU Note (Signed)
Patient reports to MAU complaining of bright red vaginal bleeding with clots since 0030. She reports bleeding through two yellow pads and ice packs. Denies cramping. Reports feeling light-headed, pale, and nauseous.

## 2021-02-01 NOTE — MAU Provider Note (Addendum)
History     CSN: [id number]  Arrival date and time: 02/01/21 0439   Event Date/Time   First Provider Initiated Contact with Patient 02/01/21 [phone]      Chief Complaint  Patient presents with  . Vaginal Bleeding   Ms. Kimberly Cortez is a 35 y.o. [id number] at ppartum who presents to MAU for excessive ppartum vaginal bleeding. Patient reports she woke up in the middle of the night and was covered in blood. She said she went to the bathroom and passed a large clot and continued to saturate two pads with bright red blood with clots. Patient had uncomplicated vaginal delivery on 01/23/2021 and was discharged home on 01/24/2021. Patient reports she had issues with excessive ppartum bleeding with her previous pregnancy, but it was not this bad. Patient reports she went to the office and was given cytotec and was able to be sent home after that. Patient reports she last ate around 6PM last night and had a small sip of water around 2AM.  Pt denies vaginal discharge/odor/itching. Pt denies N/V, abdominal pain, constipation, diarrhea, or urinary problems. Pt denies fever, chills, fatigue, sweating or changes in appetite. Pt denies SOB or chest pain. Pt denies dizziness, HA, light-headedness, weakness.  Allergies? shellfish Current medications/supplements? PNV, iron, xyzal Prenatal care provider? [facility]   OB History    Gravida  3   Para  2   Term  2   Preterm      AB  1   Living  2     SAB  1   IAB      Ectopic      Multiple  0   Live Births  2           Past Medical History:  Diagnosis Date  . Allergy   . Chlamydia   . GERD (gastroesophageal reflux disease)   . Gestational diabetes    x 2 glyburide  . Miscarriage    10/15/19    Past Surgical History:  Procedure Laterality Date  . COLONOSCOPY WITH PROPOFOL N/A 11/26/2018   Procedure: COLONOSCOPY WITH PROPOFOL;  Surgeon: Jonathon Bellows, MD;  Location: [facility] ENDOSCOPY;  Service: Gastroenterology;   Laterality: N/A;  . ESOPHAGOGASTRODUODENOSCOPY (EGD) WITH PROPOFOL N/A 11/26/2018   Procedure: ESOPHAGOGASTRODUODENOSCOPY (EGD) WITH PROPOFOL;  Surgeon: Jonathon Bellows, MD;  Location: [facility] ENDOSCOPY;  Service: Gastroenterology;  Laterality: N/A;  . WISDOM TOOTH EXTRACTION      Family History  Problem Relation Age of Onset  . Gout Mother   . Hypertension Mother   . Hypertension Father   . Diabetes Father   . Gestational diabetes Sister   . Cancer Other        breast   . Hypertension Brother   . Diabetes Maternal Grandmother   . Early death Paternal Grandfather     Social History   Tobacco Use  . Smoking status: Never Smoker  . Smokeless tobacco: Never Used  Vaping Use  . Vaping Use: Never used  Substance Use Topics  . Alcohol use: Yes    Comment: none last 24hrs  . Drug use: No    Allergies:  Allergies  Allergen Reactions  . Other Itching    Shellfish caused itching hands with touching but still eats shellfish   . Shellfish Allergy Itching    Medications Prior to Admission  Medication Sig Dispense Refill Last Dose  . acetaminophen (TYLENOL) 325 MG tablet Take 2 tablets (650 mg total) by mouth every 4 (four) hours  as needed (for pain scale < 4). 30 tablet 0 01/31/2021 at Unknown time  . docusate sodium (COLACE) 100 MG capsule Take 1 capsule (100 mg total) by mouth daily. 10 capsule 0 01/31/2021 at Unknown time  . ferrous sulfate 325 (65 FE) MG tablet Take 1 tablet (325 mg total) by mouth daily with breakfast. 30 tablet 3 01/31/2021 at Unknown time  . Prenatal MV-Min-FA-Omega-3 (PRENATAL GUMMIES/DHA & FA PO) Take by mouth.   01/31/2021 at Unknown time  . Cholecalciferol (VITAMIN D3) 25 MCG (1000 UT) CAPS Take 1,000 Units by mouth daily.     Marland Kitchen. ibuprofen (ADVIL) 600 MG tablet Take 1 tablet (600 mg total) by mouth every 6 (six) hours. 30 tablet 0     Review of Systems  Constitutional: Negative for chills, diaphoresis, fatigue and fever.  Eyes: Negative for visual disturbance.   Respiratory: Negative for shortness of breath.   Cardiovascular: Negative for chest pain.  Gastrointestinal: Negative for abdominal pain, constipation, diarrhea, nausea and vomiting.  Genitourinary: Positive for vaginal bleeding. Negative for dysuria, flank pain, frequency, pelvic pain, urgency and vaginal discharge.  Neurological: Negative for dizziness, weakness, light-headedness and headaches.   Physical Exam   Blood pressure 132/83, pulse (!) 109, temperature 97.8 F (36.6 C), temperature source Oral, SpO2 100 %, unknown if currently breastfeeding.  Patient Vitals for the past 24 hrs:  BP Temp Temp src Pulse SpO2  02/01/21 0700 132/83 - - (!) 109 -  02/01/21 0646 139/83 - - (!) 102 -  02/01/21 0645 (!) 133/91 - - (!) 118 -  02/01/21 0630 136/80 - - 95 -  02/01/21 0615 130/87 - - (!) 101 -  02/01/21 0600 134/88 - - 96 100 %  02/01/21 0555 - - - - 100 %  02/01/21 0550 - - - - 100 %  02/01/21 0545 134/90 - - (!) 103 100 %  02/01/21 0540 - - - - 100 %  02/01/21 0535 - - - - 100 %  02/01/21 0534 134/83 - - 87 -  02/01/21 0530 - - - - 100 %  02/01/21 0515 - - - - 99 %  02/01/21 0504 (!) 130/92 97.8 F (36.6 C) Oral (!) 110 99 %   Physical Exam Exam conducted with a chaperone present.  Constitutional:      General: She is not in acute distress.    Appearance: She is well-developed and well-nourished. She is not diaphoretic.  HENT:     Head: Normocephalic and atraumatic.  Pulmonary:     Effort: Pulmonary effort is normal.  Abdominal:     General: There is no distension.     Palpations: Abdomen is soft. There is no mass.     Tenderness: There is no abdominal tenderness. There is no guarding or rebound.     Comments: Firm uterine fundus unable to be palpated.  Genitourinary:    General: Normal vulva.     Labia:        Right: No rash, tenderness or lesion.        Left: No rash, tenderness or lesion.      Comments: Large amount of bright red blood pooling in vagina, cervix  unable to be visualized, blood unable to be cleared away with swabs. Skin:    General: Skin is warm and dry.  Neurological:     Mental Status: She is alert and oriented to person, place, and time.  Psychiatric:        Mood and Affect: Mood and  affect normal.        Behavior: Behavior normal.        Thought Content: Thought content normal.        Judgment: Judgment normal.    Results for orders placed or performed during the hospital encounter of 02/01/21 (from the past 24 hour(s))  CBC     Status: Abnormal   Collection Time: 02/01/21  5:15 AM  Result Value Ref Range   WBC 11.0 (H) 4.0 - 10.5 K/uL   RBC 3.34 (L) 3.87 - 5.11 MIL/uL   Hemoglobin 9.6 (L) 12.0 - 15.0 g/dL   HCT 28.9 (L) 36.0 - 46.0 %   MCV 86.5 80.0 - 100.0 fL   MCH 28.7 26.0 - 34.0 pg   MCHC 33.2 30.0 - 36.0 g/dL   RDW 13.7 11.5 - 15.5 %   Platelets 348 150 - 400 K/uL   nRBC 0.0 0.0 - 0.2 %  Comprehensive metabolic panel     Status: Abnormal   Collection Time: 02/01/21  5:15 AM  Result Value Ref Range   Sodium 140 135 - 145 mmol/L   Potassium 3.3 (L) 3.5 - 5.1 mmol/L   Chloride 105 98 - 111 mmol/L   CO2 23 22 - 32 mmol/L   Glucose, Bld 136 (H) 70 - 99 mg/dL   BUN 16 6 - 20 mg/dL   Creatinine, Ser 0.69 0.44 - 1.00 mg/dL   Calcium 8.1 (L) 8.9 - 10.3 mg/dL   Total Protein 5.8 (L) 6.5 - 8.1 g/dL   Albumin 2.9 (L) 3.5 - 5.0 g/dL   AST 23 15 - 41 U/L   ALT 57 (H) 0 - 44 U/L   Alkaline Phosphatase 92 38 - 126 U/L   Total Bilirubin 0.5 0.3 - 1.2 mg/dL   GFR, Estimated >60 >60 mL/min   Anion gap 12 5 - 15  Fibrinogen     Status: None   Collection Time: 02/01/21  7:04 AM  Result Value Ref Range   Fibrinogen 272 210 - 475 mg/dL  Hemoglobin and hematocrit, blood     Status: Abnormal   Collection Time: 02/01/21  8:56 AM  Result Value Ref Range   Hemoglobin 7.4 (L) 12.0 - 15.0 g/dL   HCT 23.1 (L) 36.0 - 46.0 %    US PELVIS (TRANSABDOMINAL ONLY)  Result Date: 02/01/2021 CLINICAL DATA:  Postpartum hemorrhage.  EXAM: TRANSABDOMINAL ULTRASOUND OF PELVIS TECHNIQUE: Transabdominal ultrasound examination of the pelvis was performed including evaluation of the uterus, ovaries, adnexal regions, and pelvic cul-de-sac. COMPARISON:  No prior. FINDINGS: Uterus Measurements: 12.5 x 7.3 x 9.5 cm. Uterus is anteverted. No fibroids or other mass visualized. Endometrium Thickness: 37 mm. Heterogeneous thickened endometrium most consistent postpartum state. No increased vascularity. Follow-up exam can be obtained to demonstrate a return to normal. Right ovary Measurements: Not visualized.  Normal appearance/no adnexal mass. Left ovary Measurements: 4.0 x 1.8 x 2.4 cm. Normal appearance/no adnexal mass. Other findings:  No abnormal free fluid. IMPRESSION: Heterogeneous thickened endometrium most consistent with postpartum state. No increased endometrial vascular flow noted. Follow-up exam can be obtained to demonstrate a return to normal. Electronically Signed   By: [place]   On: 02/01/2021 06:34    MAU Course  Procedures  MDM -excessive pp vaginal bleeding, boggy fundus -LR bolus, cytotec 864mcg rectal given -EBL prior to cytotec 342mL -3min after cytotec, 132mL on pad -CBC: H/H 9.6/28.9 (was 9.1/28.2 8days ago, starting hgb 11.3 prior to delivery), platelets 348 (was 197 8days ago) -  CMP: K 3.3, glucose 136, ALT 57 -[place]: Heterogeneous thickened endometrium most consistent with postpartum state. No increased endometrial vascular flow noted. -about one hour after cytotec, rechecked patient who passed another large clot with bright red, liquid blood behind; 282mL on scale -733mL total EBL -patient with continuous LR running -consulted with Dr. Kennon Rounds who agrees with giving 0.2mg  methergine despite mildly elevated pressures and recommends fibrinogen levels with concerns for possible clotting disorder; results of CBC reviewed with Dr. Kennon Rounds -patient denies personal or family history of bleeding disorder -0.2mg   methergine IM given -rechecked 72min after methergine administration, bleeding now mostly resolved, small clot present on pad and no bright red liquid blood flowing after clot, fundus now firm -report given to E. Purcell Nails, NP who assumes care of the patient, pending fibrinogen results Kimberly Cortez, Kimberly Nordmann, NP  7:54 AM 02/01/2021   Orders Placed This Encounter  Procedures  . US PELVIS (TRANSABDOMINAL ONLY)    Standing Status:   Standing    Number of Occurrences:   1    Order Specific Question:   Symptom/Reason for Exam    Answer:   Excessive postpartum bleeding [[id number]]  . CBC    Standing Status:   Standing    Number of Occurrences:   1  . Comprehensive metabolic panel    Standing Status:   Standing    Number of Occurrences:   1  . Fibrinogen    Standing Status:   Standing    Number of Occurrences:   1  . Diet NPO time specified    Standing Status:   Standing    Number of Occurrences:   1  . Insert peripheral IV    Standing Status:   Standing    Number of Occurrences:   1   Meds ordered this encounter  Medications  . misoprostol (CYTOTEC) 200 MCG tablet    Sedano, Alondra   : cabinet override  . misoprostol (CYTOTEC) tablet 800 mcg  . lactated ringers bolus 1,000 mL  . methylergonovine (METHERGINE) injection 0.2 mg    Assessment and Plan  Upon reassessment, bleeding has slowed down but continues to pass some half dollar sized blood clots. Measured blood loss at 850 ml. Orthostatic vital signs performed & patient became symptomatic.  Hemoglobin rechecked & dropped 9.6>7.4 in 3 hours.  C/w Dr. Elly Modena who recommends admission for blood transfusion.   Patient had some elevated DBPs. No history of hypertension & asymptomatic. ALT mildly elevated. Needs f/u recheck per Dr. Kennon Rounds.   Discussed patient with Dr. Mardelle Matte who agrees with plan for transfusion. Will place orders and come speak with patient.    1. Delayed postpartum hemorrhage   2. Excessive postpartum bleeding   3.  Elevated BP without diagnosis of hypertension   4. Elevated ALT measurement    -Dr. Mardelle Matte to admit patient for blood transfusion  Jorje Guild, NP

## 2021-02-01 NOTE — H&P (Signed)
Antepartum History and Physical   Kimberly Cortez is a 35 y.o. female [phone] that presented for heavy postpartum vaginal bleeding.  She is s/p uncomplicated SVD on 0/93/2355. Her immediate postpartum course was uncomplicated and she was discharged home.  Overnight, she noted sudden increase in vaginal bleeding including red blood and large clots.   In MAU, EBL is recorded as 772 mL total upon initial eval. Patient received 800 mcg rectal Cytotec and IM methergine. She has been intermittently lightheaded, particularly with orthostatic position changes.  She denies chest pain, palpitations. In MAU, her bleeding has been responsive to medications and is scant but symptoms of anemia have persisted.  Serial HGB indicate drop from 9.6 (upon arrival) to 7.4.  OB History    Gravida  3   Para  2   Term  2   Preterm      AB  1   Living  2     SAB  1   IAB      Ectopic      Multiple  0   Live Births  2          Past Medical History:  Diagnosis Date  . Allergy   . Chlamydia   . GERD (gastroesophageal reflux disease)   . Gestational diabetes    x 2 glyburide  . Miscarriage    10/15/19   Past Surgical History:  Procedure Laterality Date  . COLONOSCOPY WITH PROPOFOL N/A 11/26/2018   Procedure: COLONOSCOPY WITH PROPOFOL;  Surgeon: Jonathon Bellows, MD;  Location: [facility] ENDOSCOPY;  Service: Gastroenterology;  Laterality: N/A;  . ESOPHAGOGASTRODUODENOSCOPY (EGD) WITH PROPOFOL N/A 11/26/2018   Procedure: ESOPHAGOGASTRODUODENOSCOPY (EGD) WITH PROPOFOL;  Surgeon: Jonathon Bellows, MD;  Location: [facility] ENDOSCOPY;  Service: Gastroenterology;  Laterality: N/A;  . WISDOM TOOTH EXTRACTION     Family History: family history includes Cancer in an other family member; Diabetes in her father and maternal grandmother; Early death in her paternal grandfather; Gestational diabetes in her sister; Gout in her mother; Hypertension in her brother, father, and mother. Social History:  reports that she has  never smoked. She has never used smokeless tobacco. She reports current alcohol use. She reports that she does not use drugs.   Review of Systems History   Blood pressure 131/79, pulse (!) 109, temperature 97.8 F (36.6 C), temperature source Oral, SpO2 98 %, unknown if currently breastfeeding. Exam Physical Exam  Gen: alert, pallor present. No distress Chest: nonlabored breathing CV: no peripheral edema Abdomen: soft.  Fundus firm just above pubic symphysis  Assessment/Plan: . Admit to antepartum for delayed postpartum hemorrhage, symptomatic anemia for blood transfusion, close monitoring. Marland Kitchen HGB 9.6 un arrival > recheck in MAU 7.4 with symptoms of anemia.  Will transfuse 2 u pRBC and recheck HGB and coags in the AM. . S/p cytotec and methergine. Fundus firm, suspect any component related to atony was responsive to medication . Pelvic US with no apparent retained products, overall reassuring. Will allow regular diet. . Diastolic BP was noted to be in mild-elevated range. CMET with mild elevated ALT. Will recheck in AM as well and monitor BP. Marland Kitchen Breast pump to bedside.  . Bleeding improved significantly. Will observe in [facility] specialty care for increased bleeding, vitals, and treat anemia.  Blood transfusion consent discussed and signed.  All questions answered.  Carlyon Shadow 02/01/2021, 11:24 AM

## 2021-02-01 NOTE — Progress Notes (Signed)
At bedside to check on patient.  Doing well since admission, reports only small clot on pad and no bleeding otherwise.  Overall feeling significantly improved following 1u pRBC.  Only 1u was release due to blood shortage.  Post transfusion hgb stable at 7.4.  Suspect some hemodilution and equilibration.  Given patients significant clinical improvement and no longer symptomatic from anemia, will observe overnight, monitor bleeding, and recheck CBC and coags in AM.  Vitals:   02/01/21 1328 02/01/21 1631  BP: 131/73 137/82  Pulse: (!) 106 (!) 112  Resp: 18 18  Temp: 98.9 F (37.2 C) 98.7 F (37.1 C)  SpO2: 100% 100%  Gen: alert, well appearing, up to bathroom, pallor improved Chest: nonlabored breathing CV: no edema Abdomen: nondistended  All questions answered.  Alpha Gula MD

## 2021-02-02 ENCOUNTER — Encounter (HOSPITAL_COMMUNITY): Payer: Self-pay | Admitting: Obstetrics and Gynecology

## 2021-02-02 LAB — CBC
HCT: 21.2 % — ABNORMAL LOW (ref 36.0–46.0)
Hemoglobin: 7.1 g/dL — ABNORMAL LOW (ref 12.0–15.0)
MCH: 28.2 pg (ref 26.0–34.0)
MCHC: 33.5 g/dL (ref 30.0–36.0)
MCV: 84.1 fL (ref 80.0–100.0)
Platelets: 260 10*3/uL (ref 150–400)
RBC: 2.52 MIL/uL — ABNORMAL LOW (ref 3.87–5.11)
RDW: 14.7 % (ref 11.5–15.5)
WBC: 9.9 10*3/uL (ref 4.0–10.5)
nRBC: 0 % (ref 0.0–0.2)

## 2021-02-02 LAB — COMPREHENSIVE METABOLIC PANEL
ALT: 31 U/L (ref 0–44)
AST: 14 U/L — ABNORMAL LOW (ref 15–41)
Albumin: 2.2 g/dL — ABNORMAL LOW (ref 3.5–5.0)
Alkaline Phosphatase: 58 U/L (ref 38–126)
Anion gap: 7 (ref 5–15)
BUN: 10 mg/dL (ref 6–20)
CO2: 24 mmol/L (ref 22–32)
Calcium: 7.6 mg/dL — ABNORMAL LOW (ref 8.9–10.3)
Chloride: 109 mmol/L (ref 98–111)
Creatinine, Ser: 0.51 mg/dL (ref 0.44–1.00)
GFR, Estimated: >60 mL/min (ref >60)
Glucose, Bld: 117 mg/dL — ABNORMAL HIGH (ref 70–99)
Potassium: 3.3 mmol/L — ABNORMAL LOW (ref 3.5–5.1)
Sodium: 140 mmol/L (ref 135–145)
Total Bilirubin: 0.5 mg/dL (ref 0.3–1.2)
Total Protein: 4.5 g/dL — ABNORMAL LOW (ref 6.5–8.1)

## 2021-02-02 LAB — PROTIME-INR
INR: 1.1 (ref 0.8–1.2)
Prothrombin Time: 13.9 seconds (ref 11.4–15.2)

## 2021-02-02 LAB — FIBRINOGEN: Fibrinogen: 297 mg/dL (ref 210–475)

## 2021-02-02 MED ORDER — METHYLERGONOVINE MALEATE 0.2 MG PO TABS
0.2000 mg | ORAL_TABLET | Freq: Two times a day (BID) | ORAL | 0 refills | Status: DC
Start: 2021-02-02 — End: 2022-01-10

## 2021-02-02 MED ORDER — SODIUM CHLORIDE 0.9 % IV SOLN
500.0000 mg | Freq: Once | INTRAVENOUS | Status: AC
  Administered 2021-02-02: 500 mg via INTRAVENOUS
  Filled 2021-02-02: qty 25

## 2021-02-02 NOTE — Progress Notes (Signed)
S/P 1 unit PRBC Up to BR without dizziness Lochia light  Today's Vitals   02/02/21 0803 02/02/21 0808 02/02/21 1146 02/02/21 1212  BP:  134/86 140/85   Pulse:  92 (!) 106   Resp:  16 16   Temp:  98.1 F (36.7 C) 98.7 F (37.1 C)   TempSrc:   Oral   SpO2:  98% 98%   Weight:      Height:      PainSc: 0-No pain   0-No pain   Body mass index is 31.68 kg/m.   FFNT  Results for orders placed or performed during the hospital encounter of 02/01/21 (from the past 24 hour(s))  Hemoglobin and hematocrit, blood     Status: Abnormal   Collection Time: 02/01/21  5:49 PM  Result Value Ref Range   Hemoglobin 7.4 (L) 12.0 - 15.0 g/dL   HCT 22.2 (L) 36.0 - 46.0 %  CBC     Status: Abnormal   Collection Time: 02/02/21  5:04 AM  Result Value Ref Range   WBC 9.9 4.0 - 10.5 K/uL   RBC 2.52 (L) 3.87 - 5.11 MIL/uL   Hemoglobin 7.1 (L) 12.0 - 15.0 g/dL   HCT 21.2 (L) 36.0 - 46.0 %   MCV 84.1 80.0 - 100.0 fL   MCH 28.2 26.0 - 34.0 pg   MCHC 33.5 30.0 - 36.0 g/dL   RDW 14.7 11.5 - 15.5 %   Platelets 260 150 - 400 K/uL   nRBC 0.0 0.0 - 0.2 %  Fibrinogen     Status: None   Collection Time: 02/02/21  5:04 AM  Result Value Ref Range   Fibrinogen 297 210 - 475 mg/dL  Protime-INR     Status: None   Collection Time: 02/02/21  5:04 AM  Result Value Ref Range   Prothrombin Time 13.9 11.4 - 15.2 seconds   INR 1.1 0.8 - 1.2  Comprehensive metabolic panel     Status: Abnormal   Collection Time: 02/02/21  5:04 AM  Result Value Ref Range   Sodium 140 135 - 145 mmol/L   Potassium 3.3 (L) 3.5 - 5.1 mmol/L   Chloride 109 98 - 111 mmol/L   CO2 24 22 - 32 mmol/L   Glucose, Bld 117 (H) 70 - 99 mg/dL   BUN 10 6 - 20 mg/dL   Creatinine, Ser 0.51 0.44 - 1.00 mg/dL   Calcium 7.6 (L) 8.9 - 10.3 mg/dL   Total Protein 4.5 (L) 6.5 - 8.1 g/dL   Albumin 2.2 (L) 3.5 - 5.0 g/dL   AST 14 (L) 15 - 41 U/L   ALT 31 0 - 44 U/L   Alkaline Phosphatase 58 38 - 126 U/L   Total Bilirubin 0.5 0.3 - 1.2 mg/dL   GFR,  Estimated >60 >60 mL/min   Anion gap 7 5 - 15    A/P: PP hemorrhage         Anemia         D/W options. Will infuse IV iron x 1 and discharge         Methergine BID po x 6 doses at home

## 2021-02-03 NOTE — Discharge Summary (Addendum)
Physician Discharge Summary  Patient ID: Kimberly Cortez MRN: [id number] DOB/AGE: [birth year] 36 y.o.  Admit date: 02/01/2021 Discharge date: 02/02/2021  Admission Diagnoses: Post Partum Hemorrhage  Discharge Diagnoses:  Active Problems:   Delayed postpartum hemorrhage   Discharged Condition: good  Hospital Course: Patient presents on PPD #9 with heavy vaginal bleeding. She had measured blood loss of 741ml in MAU. The bleeding responded to Cytotec 800 mcg PR and Methergine IM. Hgb dropped from 9.6 to 7.4 and she was symptomatic with tachycardia and lightheaded. IV fluids were started and 2 units of PRBC were ordered. However, because of blood shortage, the blood bank only released one unit. After that the patient felt better and ambulates without difficulty. Hgb is 7.1. Options were discussed and she received Venofer 500mg  IV. She had PP hemorrhage with pregnancy #1 as well and is worried about heavy bleeding resuming. She is discharged with a prescription for methergine 0.2 mg po. Careful instructions are given to pump and dump breast milk for at least 12 hours after last dose to avoid side effects for breastfeeding baby.  Consults: None  Significant Diagnostic Studies: labs: No results found for this or any previous visit (from the past 24 hour(s)).  Treatments: IV hydration and transfusion PRBC x 1 unit  Discharge Exam: Blood pressure 131/77, pulse (!) 101, temperature 98.7 F (37.1 C), temperature source Oral, resp. rate 18, height 5' 2.5" (1.588 m), weight 79.8 kg, SpO2 97 %, unknown if currently breastfeeding. General appearance: alert, cooperative and no distress  Disposition: Discharge disposition: 01-Home or Self Care         Allergies as of 02/02/2021       Reactions   Shellfish Allergy Itching        Medication List     STOP taking these medications    NONFORMULARY OR COMPOUNDED ITEM   sennosides-docusate sodium 8.6-50 MG tablet Commonly known as:  SENOKOT-S   Vitamin D 50 MCG (2000 UT) tablet       TAKE these medications    acetaminophen 325 MG tablet Commonly known as: Tylenol Take 2 tablets (650 mg total) by mouth every 4 (four) hours as needed (for pain scale < 4).   ibuprofen 600 MG tablet Commonly known as: ADVIL Take 1 tablet (600 mg total) by mouth every 6 (six) hours.   levocetirizine 5 MG tablet Commonly known as: XYZAL Take 5 mg by mouth every evening.   methylergonovine 0.2 MG tablet Commonly known as: Methergine Take 1 tablet (0.2 mg total) by mouth 2 (two) times daily.   prenatal multivitamin Tabs tablet Take 1 tablet by mouth daily at 12 noon.          Signed: Shon Millet II 02/03/2021, 7:49 AM

## 2021-02-05 LAB — TYPE AND SCREEN
ABO/RH(D): O POS
Antibody Screen: NEGATIVE
Unit division: 0
Unit division: 0

## 2021-02-05 LAB — BPAM RBC
Blood Product Expiration Date: 202203082359
Blood Product Expiration Date: 202203082359
ISSUE DATE / TIME: 202202021307
Unit Type and Rh: 5100
Unit Type and Rh: 5100

## 2021-03-14 ENCOUNTER — Other Ambulatory Visit (HOSPITAL_COMMUNITY): Payer: Self-pay | Admitting: Obstetrics & Gynecology

## 2021-03-24 ENCOUNTER — Encounter: Payer: Self-pay | Admitting: Internal Medicine

## 2021-03-24 ENCOUNTER — Telehealth: Payer: Self-pay | Admitting: Internal Medicine

## 2021-03-24 ENCOUNTER — Other Ambulatory Visit (HOSPITAL_COMMUNITY): Payer: Self-pay | Admitting: Obstetrics & Gynecology

## 2021-03-24 MED FILL — LEVOCETIRIZINE 5 MG TABLET: 5 | 90 days supply | Qty: 90 | Fill #1

## 2021-03-24 MED FILL — SPRINTEC 28 DAY TABLET: 0.25-35 | 28 days supply | Qty: 28 | Fill #0

## 2021-03-24 NOTE — Telephone Encounter (Signed)
Rejection Reason - Other - It has been over 60 days since we first try to contact patient. Patient never called back to schedule appointment." Kimberly Cortez Kimberly Cortez said on Mar 24, 2021 2:56 PM  "received referral 01/10/2021/lvm to schedule" Unknown User said on Jan 13, 2021 9:17 AM  Kimberly Cortez ent

## 2021-04-24 ENCOUNTER — Other Ambulatory Visit (HOSPITAL_COMMUNITY): Payer: Self-pay

## 2021-04-24 MED FILL — Norgestimate & Ethinyl Estradiol Tab 0.25 MG-35 MCG: ORAL | 84 days supply | Qty: 84 | Fill #0 | Status: AC

## 2021-06-28 ENCOUNTER — Other Ambulatory Visit (HOSPITAL_COMMUNITY): Payer: Self-pay

## 2021-06-28 MED FILL — Norethindrone Tab 0.35 MG: ORAL | 84 days supply | Qty: 84 | Fill #0 | Status: CN

## 2021-06-28 MED FILL — Norgestimate & Ethinyl Estradiol Tab 0.25 MG-35 MCG: ORAL | 84 days supply | Qty: 84 | Fill #1 | Status: AC

## 2021-06-28 MED FILL — Levocetirizine Dihydrochloride Tab 5 MG: ORAL | 90 days supply | Qty: 90 | Fill #0 | Status: AC

## 2021-06-30 ENCOUNTER — Other Ambulatory Visit (HOSPITAL_COMMUNITY): Payer: Self-pay

## 2021-10-02 ENCOUNTER — Other Ambulatory Visit (HOSPITAL_COMMUNITY): Payer: Self-pay

## 2021-10-02 MED FILL — Norgestimate & Ethinyl Estradiol Tab 0.25 MG-35 MCG: ORAL | 84 days supply | Qty: 84 | Fill #2 | Status: AC

## 2021-10-02 MED FILL — Levocetirizine Dihydrochloride Tab 5 MG: ORAL | 90 days supply | Qty: 90 | Fill #1 | Status: AC

## 2022-01-02 ENCOUNTER — Other Ambulatory Visit (HOSPITAL_COMMUNITY): Payer: Self-pay

## 2022-01-02 ENCOUNTER — Other Ambulatory Visit: Payer: Self-pay

## 2022-01-02 MED ORDER — NORGESTIMATE-ETH ESTRADIOL 0.25-35 MG-MCG PO TABS
1.0000 | ORAL_TABLET | Freq: Every day | ORAL | 0 refills | Status: DC
Start: 2022-01-02 — End: 2022-03-02
  Filled 2022-01-02 (×2): qty 84, 84d supply, fill #0

## 2022-01-02 MED ORDER — LEVOCETIRIZINE DIHYDROCHLORIDE 5 MG PO TABS
5.0000 mg | ORAL_TABLET | Freq: Every day | ORAL | 3 refills | Status: DC
  Filled 2022-01-02: qty 90, 90d supply, fill #0
  Filled 2022-03-01 – 2022-03-26 (×2): qty 90, 90d supply, fill #1
  Filled 2022-06-26: qty 90, 90d supply, fill #2
  Filled 2022-09-24 – 2022-10-05 (×2): qty 90, 90d supply, fill #3

## 2022-01-02 MED FILL — Norgestimate & Ethinyl Estradiol Tab 0.25 MG-35 MCG: ORAL | 28 days supply | Qty: 28 | Fill #3 | Status: CN

## 2022-01-10 ENCOUNTER — Encounter: Payer: No Typology Code available for payment source | Admitting: Internal Medicine

## 2022-01-10 ENCOUNTER — Encounter: Payer: Self-pay | Admitting: Internal Medicine

## 2022-01-10 ENCOUNTER — Ambulatory Visit (INDEPENDENT_AMBULATORY_CARE_PROVIDER_SITE_OTHER): Payer: No Typology Code available for payment source | Admitting: Internal Medicine

## 2022-01-10 ENCOUNTER — Other Ambulatory Visit: Payer: Self-pay

## 2022-01-10 VITALS — BP 140/90 | HR 92 | Temp 98.0°F | Ht 62.95 in | Wt 181.6 lb

## 2022-01-10 DIAGNOSIS — Z13220 Encounter for screening for lipoid disorders: Secondary | ICD-10-CM

## 2022-01-10 DIAGNOSIS — E876 Hypokalemia: Secondary | ICD-10-CM | POA: Diagnosis not present

## 2022-01-10 DIAGNOSIS — E559 Vitamin D deficiency, unspecified: Secondary | ICD-10-CM

## 2022-01-10 DIAGNOSIS — Z0000 Encounter for general adult medical examination without abnormal findings: Secondary | ICD-10-CM | POA: Diagnosis not present

## 2022-01-10 DIAGNOSIS — O24419 Gestational diabetes mellitus in pregnancy, unspecified control: Secondary | ICD-10-CM

## 2022-01-10 DIAGNOSIS — D649 Anemia, unspecified: Secondary | ICD-10-CM

## 2022-01-10 DIAGNOSIS — Z1389 Encounter for screening for other disorder: Secondary | ICD-10-CM

## 2022-01-10 DIAGNOSIS — R030 Elevated blood-pressure reading, without diagnosis of hypertension: Secondary | ICD-10-CM | POA: Insufficient documentation

## 2022-01-10 DIAGNOSIS — R739 Hyperglycemia, unspecified: Secondary | ICD-10-CM

## 2022-01-10 DIAGNOSIS — Z1329 Encounter for screening for other suspected endocrine disorder: Secondary | ICD-10-CM

## 2022-01-10 DIAGNOSIS — R000 Tachycardia, unspecified: Secondary | ICD-10-CM

## 2022-01-10 LAB — COMPREHENSIVE METABOLIC PANEL
ALT: 41 U/L — ABNORMAL HIGH (ref 0–35)
AST: 30 U/L (ref 0–37)
Albumin: 4.2 g/dL (ref 3.5–5.2)
Alkaline Phosphatase: 64 U/L (ref 39–117)
BUN: 14 mg/dL (ref 6–23)
CO2: 28 mEq/L (ref 19–32)
Calcium: 8.9 mg/dL (ref 8.4–10.5)
Chloride: 103 mEq/L (ref 96–112)
Creatinine, Ser: 0.51 mg/dL (ref 0.40–1.20)
GFR: 120.23 mL/min (ref >60.00)
Glucose, Bld: 117 mg/dL — ABNORMAL HIGH (ref 70–99)
Potassium: 4.1 mEq/L (ref 3.5–5.1)
Sodium: 138 mEq/L (ref 135–145)
Total Bilirubin: 0.6 mg/dL (ref 0.2–1.2)
Total Protein: 6.8 g/dL (ref 6.0–8.3)

## 2022-01-10 LAB — CBC WITH DIFFERENTIAL/PLATELET
Basophils Absolute: 0.1 10*3/uL (ref 0.0–0.1)
Basophils Relative: 0.7 % (ref 0.0–3.0)
Eosinophils Absolute: 0.2 10*3/uL (ref 0.0–0.7)
Eosinophils Relative: 2.3 % (ref 0.0–5.0)
HCT: 39.6 % (ref 36.0–46.0)
Hemoglobin: 13.1 g/dL (ref 12.0–15.0)
Lymphocytes Relative: 26.4 % (ref 12.0–46.0)
Lymphs Abs: 1.9 10*3/uL (ref 0.7–4.0)
MCHC: 33 g/dL (ref 30.0–36.0)
MCV: 81.9 fl (ref 78.0–100.0)
Monocytes Absolute: 0.5 10*3/uL (ref 0.1–1.0)
Monocytes Relative: 6.5 % (ref 3.0–12.0)
Neutro Abs: 4.7 10*3/uL (ref 1.4–7.7)
Neutrophils Relative %: 64.1 % (ref 43.0–77.0)
Platelets: 337 10*3/uL (ref 150.0–400.0)
RBC: 4.84 Mil/uL (ref 3.87–5.11)
RDW: 13.5 % (ref 11.5–15.5)
WBC: 7.4 10*3/uL (ref 4.0–10.5)

## 2022-01-10 LAB — TSH: TSH: 0.63 u[IU]/mL (ref 0.35–5.50)

## 2022-01-10 LAB — VITAMIN D 25 HYDROXY (VIT D DEFICIENCY, FRACTURES): VITD: 42.07 ng/mL (ref 30.00–100.00)

## 2022-01-10 LAB — LIPID PANEL
Cholesterol: 132 mg/dL (ref 0–200)
HDL: 50.2 mg/dL (ref >39.00)
LDL Cholesterol: 59 mg/dL (ref 0–99)
NonHDL: 81.79
Total CHOL/HDL Ratio: 3
Triglycerides: 115 mg/dL (ref 0.0–149.0)
VLDL: 23 mg/dL (ref 0.0–40.0)

## 2022-01-10 NOTE — Patient Instructions (Addendum)
Check blood pressure goal is <130/<80 omron automatic blood pressure cuff  Call Dr. Lynnette Caffey and schedule pap smear please  Consider 4th covid shot  DASH Eating Plan DASH stands for Dietary Approaches to Stop Hypertension. The DASH eating plan is a healthy eating plan that has been shown to: Reduce high blood pressure (hypertension). Reduce your risk for type 2 diabetes, heart disease, and stroke. Help with weight loss. What are tips for following this plan? Reading food labels Check food labels for the amount of salt (sodium) per serving. Choose foods with less than 5 percent of the Daily Value of sodium. Generally, foods with less than 300 milligrams (mg) of sodium per serving fit into this eating plan. To find whole grains, look for the word "whole" as the first word in the ingredient list. Shopping Buy products labeled as "low-sodium" or "no salt added." Buy fresh foods. Avoid canned foods and pre-made or frozen meals. Cooking Avoid adding salt when cooking. Use salt-free seasonings or herbs instead of table salt or sea salt. Check with your health care provider or pharmacist before using salt substitutes. Do not fry foods. Cook foods using healthy methods such as baking, boiling, grilling, roasting, and broiling instead. Cook with heart-healthy oils, such as olive, canola, avocado, soybean, or sunflower oil. Meal planning  Eat a balanced diet that includes: 4 or more servings of fruits and 4 or more servings of vegetables each day. Try to fill one-half of your plate with fruits and vegetables. 6-8 servings of whole grains each day. Less than 6 oz (170 g) of lean meat, poultry, or fish each day. A 3-oz (85-g) serving of meat is about the same size as a deck of cards. One egg equals 1 oz (28 g). 2-3 servings of low-fat dairy each day. One serving is 1 cup (237 mL). 1 serving of nuts, seeds, or beans 5 times each week. 2-3 servings of heart-healthy fats. Healthy fats called omega-3 fatty  acids are found in foods such as walnuts, flaxseeds, fortified milks, and eggs. These fats are also found in cold-water fish, such as sardines, salmon, and mackerel. Limit how much you eat of: Canned or prepackaged foods. Food that is high in trans fat, such as some fried foods. Food that is high in saturated fat, such as fatty meat. Desserts and other sweets, sugary drinks, and other foods with added sugar. Full-fat dairy products. Do not salt foods before eating. Do not eat more than 4 egg yolks a week. Try to eat at least 2 vegetarian meals a week. Eat more home-cooked food and less restaurant, buffet, and fast food. Lifestyle When eating at a restaurant, ask that your food be prepared with less salt or no salt, if possible. If you drink alcohol: Limit how much you use to: 0-1 drink a day for women who are not pregnant. 0-2 drinks a day for men. Be aware of how much alcohol is in your drink. In the [place] one drink equals one 12 oz bottle of beer (355 mL), one 5 oz glass of wine (148 mL), or one 1 oz glass of hard liquor (44 mL). General information Avoid eating more than 2,300 mg of salt a day. If you have hypertension, you may need to reduce your sodium intake to 1,500 mg a day. Work with your health care provider to maintain a healthy body weight or to lose weight. Ask what an ideal weight is for you. Get at least 30 minutes of exercise that causes your heart  to beat faster (aerobic exercise) most days of the week. Activities may include walking, swimming, or biking. Work with your health care provider or dietitian to adjust your eating plan to your individual calorie needs. What foods should I eat? Fruits All fresh, dried, or frozen fruit. Canned fruit in natural juice (without added sugar). Vegetables Fresh or frozen vegetables (raw, steamed, roasted, or grilled). Low-sodium or reduced-sodium tomato and vegetable juice. Low-sodium or reduced-sodium tomato sauce and tomato paste.  Low-sodium or reduced-sodium canned vegetables. Grains Whole-grain or whole-wheat bread. Whole-grain or whole-wheat pasta. Brown rice. [place]. Bulgur. Whole-grain and low-sodium cereals. Pita bread. Low-fat, low-sodium crackers. Whole-wheat flour tortillas. Meats and other proteins Skinless chicken or [place]. Ground chicken or [place]. Pork with fat trimmed off. Fish and seafood. Egg whites. Dried beans, peas, or lentils. Unsalted nuts, nut butters, and seeds. Unsalted canned beans. Lean cuts of beef with fat trimmed off. Low-sodium, lean precooked or cured meat, such as sausages or meat loaves. Dairy Low-fat (1%) or fat-free (skim) milk. Reduced-fat, low-fat, or fat-free cheeses. Nonfat, low-sodium ricotta or cottage cheese. Low-fat or nonfat yogurt. Low-fat, low-sodium cheese. Fats and oils Soft margarine without trans fats. Vegetable oil. Reduced-fat, low-fat, or light mayonnaise and salad dressings (reduced-sodium). Canola, safflower, olive, avocado, soybean, and sunflower oils. Avocado. Seasonings and condiments Herbs. Spices. Seasoning mixes without salt. Other foods Unsalted popcorn and pretzels. Fat-free sweets. The items listed above may not be a complete list of foods and beverages you can eat. Contact a dietitian for more information. What foods should I avoid? Fruits Canned fruit in a light or heavy syrup. Fried fruit. Fruit in cream or butter sauce. Vegetables Creamed or fried vegetables. Vegetables in a cheese sauce. Regular canned vegetables (not low-sodium or reduced-sodium). Regular canned tomato sauce and paste (not low-sodium or reduced-sodium). Regular tomato and vegetable juice (not low-sodium or reduced-sodium). Angie Fava. Olives. Grains Baked goods made with fat, such as croissants, muffins, or some breads. Dry pasta or rice meal packs. Meats and other proteins Fatty cuts of meat. Ribs. Fried meat. Berniece Salines. Bologna, salami, and other precooked or cured meats, such as  sausages or meat loaves. Fat from the back of a pig (fatback). Bratwurst. Salted nuts and seeds. Canned beans with added salt. Canned or smoked fish. Whole eggs or egg yolks. Chicken or [place] with skin. Dairy Whole or 2% milk, cream, and half-and-half. Whole or full-fat cream cheese. Whole-fat or sweetened yogurt. Full-fat cheese. Nondairy creamers. Whipped toppings. Processed cheese and cheese spreads. Fats and oils Butter. Stick margarine. Lard. Shortening. Ghee. Bacon fat. Tropical oils, such as coconut, palm kernel, or palm oil. Seasonings and condiments Onion salt, garlic salt, seasoned salt, table salt, and sea salt. Worcestershire sauce. Tartar sauce. Barbecue sauce. Teriyaki sauce. Soy sauce, including reduced-sodium. Steak sauce. Canned and packaged gravies. Fish sauce. Oyster sauce. Cocktail sauce. Store-bought horseradish. Ketchup. Mustard. Meat flavorings and tenderizers. Bouillon cubes. Hot sauces. Pre-made or packaged marinades. Pre-made or packaged taco seasonings. Relishes. Regular salad dressings. Other foods Salted popcorn and pretzels. The items listed above may not be a complete list of foods and beverages you should avoid. Contact a dietitian for more information. Where to find more information National Heart, Lung, and Blood Institute: https://wilson-eaton.com/ American Heart Association: www.heart.org Academy of Nutrition and Dietetics: www.eatright.[place]: www.kidney.org Summary The DASH eating plan is a healthy eating plan that has been shown to reduce high blood pressure (hypertension). It may also reduce your risk for type 2 diabetes, heart disease, and stroke.  When on the DASH eating plan, aim to eat more fresh fruits and vegetables, whole grains, lean proteins, low-fat dairy, and heart-healthy fats. With the DASH eating plan, you should limit salt (sodium) intake to 2,300 mg a day. If you have hypertension, you may need to reduce your sodium intake to  1,500 mg a day. Work with your health care provider or dietitian to adjust your eating plan to your individual calorie needs. This information is not intended to replace advice given to you by your health care provider. Make sure you discuss any questions you have with your health care provider. Document Revised: 11/20/2019 Document Reviewed: 11/20/2019 Elsevier Patient Education  2022 [place].   Sinus Tachycardia Sinus tachycardia is a kind of fast heartbeat. In sinus tachycardia, the heart beats more than 100 times a minute. Sinus tachycardia starts in a part of the heart called the sinus node. Sinus tachycardia may be harmless, or it may be a sign of a serious condition. What are the causes? This condition may be caused by: Exercise or exertion. A fever. Pain. Loss of body fluids (dehydration). Severe bleeding (hemorrhage). Anxiety and stress. Certain substances, including: Alcohol. Caffeine. Tobacco and nicotine products. Cold medicines. Illegal drugs. Medical conditions including: Heart disease. An infection. An overactive thyroid (hyperthyroidism). A lack of red blood cells (anemia). What are the signs or symptoms? Symptoms of this condition include: A feeling that the heart is beating quickly (palpitations). Suddenly noticing your heartbeat (cardiac awareness). Dizziness. Tiredness (fatigue). Shortness of breath. Chest pain. Nausea. Fainting. How is this diagnosed? This condition is diagnosed with: A physical exam. Other tests, such as: Blood tests. An electrocardiogram (ECG). This test measures the electrical activity of the heart. Ambulatory cardiac monitor. This records your heartbeats for 24 hours or more. You may be referred to a heart specialist (cardiologist). How is this treated? Treatment for this condition depends on the cause or the underlying condition. Treatment may involve: Treating the underlying condition. Taking new medicines or changing  your current medicines as told by your health care provider. Making changes to your diet or lifestyle. Follow these instructions at home: Lifestyle  Do not use any products that contain nicotine or tobacco, such as cigarettes and e-cigarettes. If you need help quitting, ask your health care provider. Do not use illegal drugs, such as cocaine. Learn relaxation methods to help you when you get stressed or anxious. These include deep breathing. Avoid caffeine or other stimulants. Alcohol use  Do not drink alcohol if: Your health care provider tells you not to drink. You are pregnant, may be pregnant, or are planning to become pregnant. If you drink alcohol, limit how much you have: 0-1 drink a day for women. 0-2 drinks a day for men. Be aware of how much alcohol is in your drink. In the [place] one drink equals one typical bottle of beer (12 oz), one-half glass of wine (5 oz), or one shot of hard liquor (1 oz). General instructions Drink enough fluids to keep your urine pale yellow. Take over-the-counter and prescription medicines only as told by your health care provider. Keep all follow-up visits as told by your health care provider. This is important. Contact a health care provider if you have: A fever. Vomiting or diarrhea that does not go away. Get help right away if you: Have pain in your chest, upper arms, jaw, or neck. Become weak or dizzy. Feel faint. Have palpitations that do not go away. Summary In sinus tachycardia, the heart beats  more than 100 times a minute. Sinus tachycardia may be harmless, or it may be a sign of a serious condition. Treatment for this condition depends on the cause or the underlying condition. Get help right away if you have pain in your chest, upper arms, jaw, or neck. This information is not intended to replace advice given to you by your health care provider. Make sure you discuss any questions you have with your health care provider. Document  Revised: 04/27/2021 Document Reviewed: 04/27/2021 Elsevier Patient Education  2022 [employer].

## 2022-01-10 NOTE — Progress Notes (Signed)
Chief Complaint  Patient presents with   Annual Exam   Gynecologic Exam   Annual  1. Elevated Bp younger brother htn and mom no personal history of with pregnancy 2. Pap due will f/u ob/gyn 3. At times increased HR in the 120s with stress/caffeine  Review of Systems  Constitutional:  Negative for weight loss.  HENT:  Negative for hearing loss.   Eyes:  Negative for blurred vision.  Respiratory:  Negative for shortness of breath.   Cardiovascular:  Positive for palpitations. Negative for chest pain.  Gastrointestinal:  Negative for abdominal pain and blood in stool.  Genitourinary:  Negative for dysuria.  Musculoskeletal:  Negative for falls and joint pain.  Skin:  Negative for rash.  Neurological:  Negative for headaches.  Psychiatric/Behavioral:  Negative for depression.   Past Medical History:  Diagnosis Date   Allergy    Anemia    postpartum hemorrhaging after pregnancy 2022   Chlamydia    Encounter for blood transfusion    02/01/21   GERD (gastroesophageal reflux disease)    Gestational diabetes    x 2 glyburide   Miscarriage    10/15/19   Past Surgical History:  Procedure Laterality Date   COLONOSCOPY WITH PROPOFOL N/A 11/26/2018   Procedure: COLONOSCOPY WITH PROPOFOL;  Surgeon: Jonathon Bellows, MD;  Location: [facility] ENDOSCOPY;  Service: Gastroenterology;  Laterality: N/A;   ESOPHAGOGASTRODUODENOSCOPY (EGD) WITH PROPOFOL N/A 11/26/2018   Procedure: ESOPHAGOGASTRODUODENOSCOPY (EGD) WITH PROPOFOL;  Surgeon: Jonathon Bellows, MD;  Location: [facility] ENDOSCOPY;  Service: Gastroenterology;  Laterality: N/A;   WISDOM TOOTH EXTRACTION     Family History  Problem Relation Age of Onset   Gout Mother    Hypertension Mother    Hypertension Father    Diabetes Father    Gestational diabetes Sister    Cancer Other        breast    Hypertension Brother    Diabetes Maternal Grandmother    Early death Paternal Grandfather    Social History   Socioeconomic History   Marital status:  Married    Spouse name: Not on file   Number of children: Not on file   Years of education: Not on file   Highest education level: Not on file  Occupational History   Not on file  Tobacco Use   Smoking status: Never   Smokeless tobacco: Never  Vaping Use   Vaping Use: Never used  Substance and Sexual Activity   Alcohol use: Yes    Comment: none last 24hrs   Drug use: No   Sexual activity: Yes  Other Topics Concern   Not on file  Social History Narrative   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   norgestimate-ethinyl estradiol (SPRINTEC 28) 0.25-35 MG-MCG tablet Take 1 tablet by mouth daily.   VITAMIN D PO Take by mouth daily.   Allergies  Allergen Reactions   Shellfish Allergy Itching  No results found for this or any previous visit (from the past 2160 hour(s)). Objective  Body mass index is 32.22 kg/m. Wt Readings from Last 3 Encounters:  01/10/22 181 lb 9.6 oz (82.4 kg)  02/01/21 176 lb (79.8 kg)  01/23/21 191 lb 3.2 oz (86.7 kg)   Temp Readings from Last 3 Encounters:  01/10/22 98 F (36.7 C) (Temporal)  02/02/21 98.7 F (37.1 C) (Oral)  01/24/21 98 F (36.7 C) (Oral)   BP Readings from Last 3 Encounters:  01/10/22 140/90  02/02/21 131/77  01/24/21 128/87   Pulse Readings from Last 3 Encounters:  01/10/22 92  02/02/21 (!) 101  01/24/21 93    Physical Exam Vitals and nursing note reviewed.  Constitutional:      Appearance: Normal appearance. She is well-developed and  well-groomed.  HENT:     Head: Normocephalic and atraumatic.  Eyes:     Conjunctiva/sclera: Conjunctivae normal.     Pupils: Pupils are equal, round, and reactive to light.  Cardiovascular:     Rate and Rhythm: Normal rate and regular rhythm.     Heart sounds: Normal heart sounds. No murmur heard. Pulmonary:     Effort: Pulmonary effort is normal.     Breath sounds: Normal breath sounds.  Abdominal:     General: Abdomen is flat. Bowel sounds are normal.     Tenderness: There is no abdominal tenderness.  Musculoskeletal:        General: No tenderness.  Skin:    General: Skin is warm and dry.  Neurological:     General: No focal deficit present.     Mental Status: She is alert and oriented to person, place, and time. Mental status is at baseline.     Cranial Nerves: Cranial nerves 2-12 are intact.     Gait: Gait is intact.  Psychiatric:        Attention and Perception: Attention and perception normal.        Mood and Affect: Mood and affect normal.        Speech: Speech normal.        Behavior: Behavior normal. Behavior is cooperative.        Thought Content: Thought content normal.        Cognition and Memory: Cognition and memory normal.        Judgment: Judgment normal.    Assessment  Plan  Annual physical exam - Plan: labs today See below    Vitamin D deficiency - Plan: Vitamin D (25 hydroxy)  Sinus tachycardia could be due to stress/caffeine   HM flu shot 09/29/21  Tdap utd covid pfizer 3/3 consider 4th  Pap utd [facility] pap due 11/2020 megan morris  Cont healthy diet and exercise  Of note stopped singular due to thinking she had anxiety from it Dr. Pryor Ochoa    Of note had blood transfusion 1 unit 02/01/21 due to sx matic anemia postpartum hemorrhaging  Provider: Dr. Olivia Mackie McLean-Scocuzza-Internal Medicine

## 2022-01-11 ENCOUNTER — Other Ambulatory Visit (INDEPENDENT_AMBULATORY_CARE_PROVIDER_SITE_OTHER): Payer: No Typology Code available for payment source

## 2022-01-11 DIAGNOSIS — O24419 Gestational diabetes mellitus in pregnancy, unspecified control: Secondary | ICD-10-CM | POA: Diagnosis not present

## 2022-01-11 DIAGNOSIS — R739 Hyperglycemia, unspecified: Secondary | ICD-10-CM

## 2022-01-11 LAB — IRON,TIBC AND FERRITIN PANEL
%SAT: 33 % (calc) (ref 16–45)
Ferritin: 25 ng/mL (ref 16–154)
Iron: 151 ug/dL (ref 40–190)
TIBC: 461 mcg/dL (calc) — ABNORMAL HIGH (ref 250–450)

## 2022-01-11 LAB — URINALYSIS, ROUTINE W REFLEX MICROSCOPIC
Bacteria, UA: NONE SEEN /HPF
Bilirubin Urine: NEGATIVE
Glucose, UA: NEGATIVE
Hgb urine dipstick: NEGATIVE
Hyaline Cast: NONE SEEN /LPF
Ketones, ur: NEGATIVE
Leukocytes,Ua: NEGATIVE
Nitrite: NEGATIVE
Specific Gravity, Urine: 1.024 (ref 1.001–1.035)
WBC, UA: NONE SEEN /HPF (ref 0–5)
pH: 6.5 (ref 5.0–8.0)

## 2022-01-11 LAB — MICROSCOPIC MESSAGE

## 2022-01-11 NOTE — Addendum Note (Signed)
Addended by: Orland Mustard on: 01/11/2022 01:02 PM   Modules accepted: Orders

## 2022-01-11 NOTE — Telephone Encounter (Signed)
Kimberly Cortez, [place]  01/11/2022  9:50 AM EST Back to Top    I called and spoke with the patient and informed her of her results and she understood.   Patient does not want to do a [place] at this time and she only drinks less than 2 alcohol beverages per week.  Patient asked if provider could add on a A1C if its not to late because she would like to know those results.  Nina,cma    [name], MD  01/11/2022  8:16 AM EST     1 of liver enzymes elevated  Is she agreeable [place] to work up for this I.e fatty liver or gallstones?  Is she drinking alcohol?  Cholesterol normal  Vitamin D normal Thyroid lab normal Blood cts normal  Urine trace protein not worried about this for now iron increased but still borderline low consider prenatal with iron daily

## 2022-01-12 LAB — HEMOGLOBIN A1C: Hgb A1c MFr Bld: 5.5 % (ref 4.6–6.5)

## 2022-01-29 ENCOUNTER — Encounter: Payer: Self-pay | Admitting: Internal Medicine

## 2022-01-30 NOTE — Telephone Encounter (Signed)
Please advise  on elevated numbers. Patient reports no symptoms

## 2022-03-01 ENCOUNTER — Other Ambulatory Visit: Payer: Self-pay | Admitting: Internal Medicine

## 2022-03-01 ENCOUNTER — Other Ambulatory Visit (HOSPITAL_COMMUNITY): Payer: Self-pay

## 2022-03-01 DIAGNOSIS — I10 Essential (primary) hypertension: Secondary | ICD-10-CM

## 2022-03-01 MED ORDER — AMLODIPINE BESYLATE 5 MG PO TABS
5.0000 mg | ORAL_TABLET | Freq: Every morning | ORAL | 3 refills | Status: DC
  Filled 2022-03-01: qty 90, 90d supply, fill #0
  Filled 2022-06-07: qty 90, 90d supply, fill #1

## 2022-03-02 ENCOUNTER — Other Ambulatory Visit (HOSPITAL_COMMUNITY): Payer: Self-pay

## 2022-03-02 MED ORDER — NORGESTIMATE-ETH ESTRADIOL 0.25-35 MG-MCG PO TABS
1.0000 | ORAL_TABLET | Freq: Every day | ORAL | 0 refills | Status: DC
  Filled 2022-03-02 – 2022-03-26 (×2): qty 84, 84d supply, fill #0

## 2022-03-04 IMAGING — US US PELVIS COMPLETE
1 series · 15 of 25 positions shown · non-contrast
Comparison: No prior.

CLINICAL DATA: Postpartum hemorrhage.

EXAM:
TRANSABDOMINAL ULTRASOUND OF PELVIS
TECHNIQUE: Transabdominal ultrasound examination of the pelvis was performed
including evaluation of the uterus, ovaries, adnexal regions, and
pelvic cul-de-sac.

[Series 1: us pelvis complete · 15 of 36 slices shown]
[im 1/36]
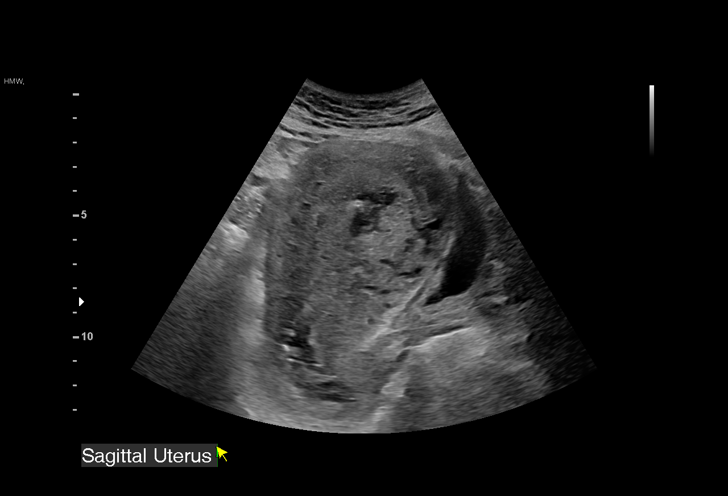
[im 3/36]
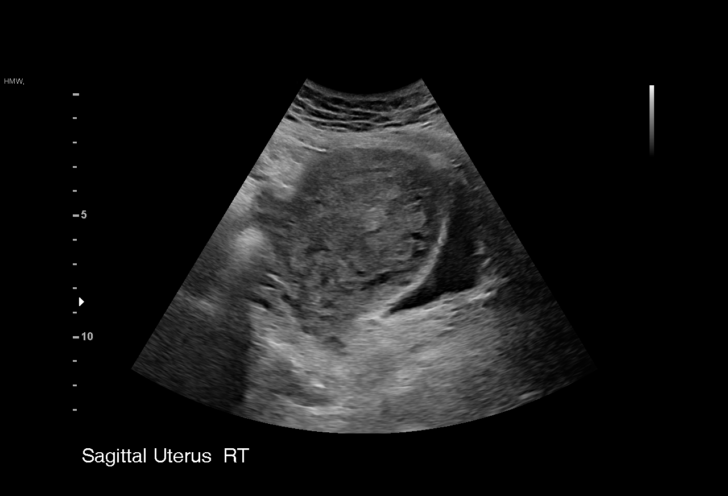
[im 6/36]
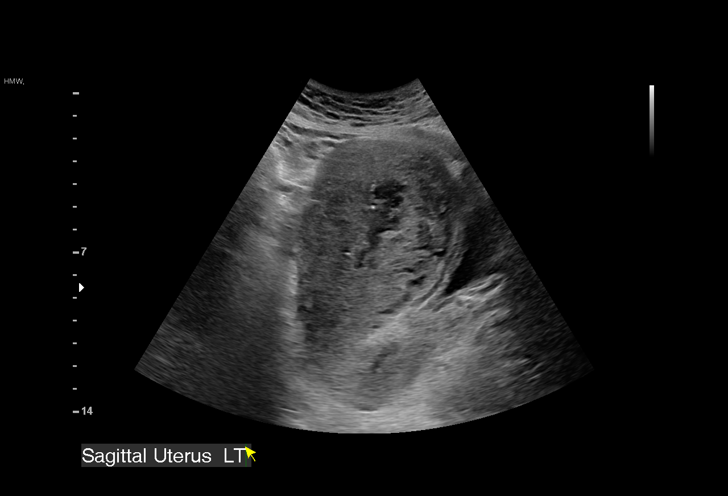
[im 8/36]
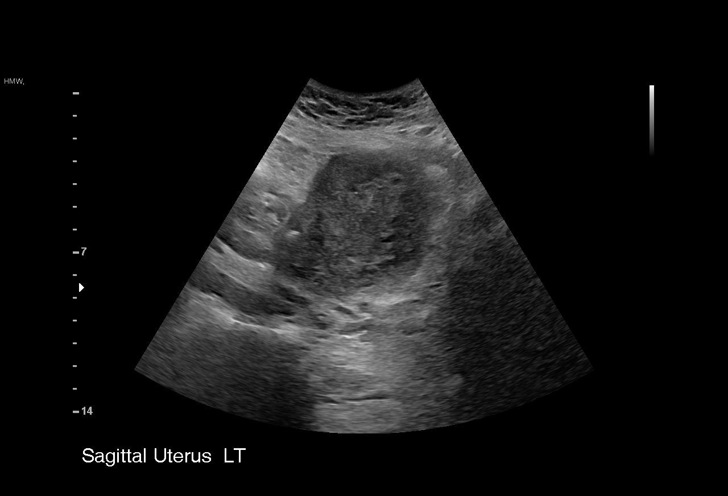
[im 11/36]
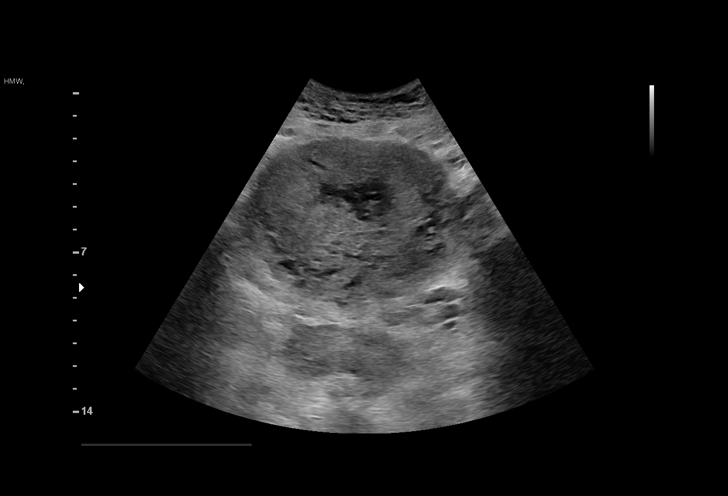
[im 14/36]
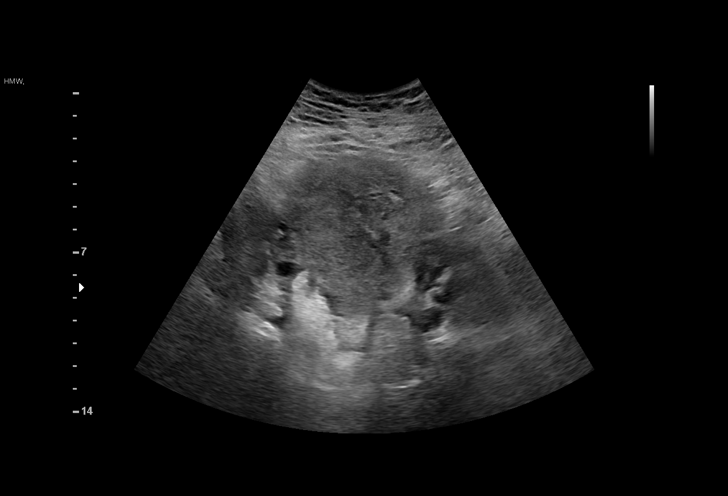
[im 15/36]
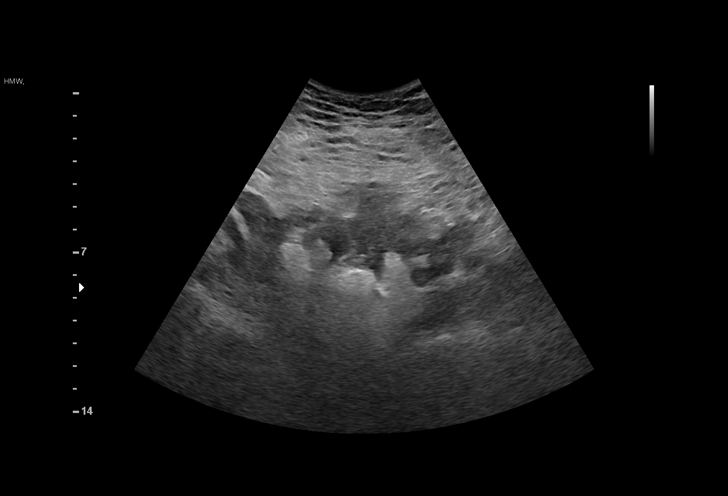
[im 18/36]
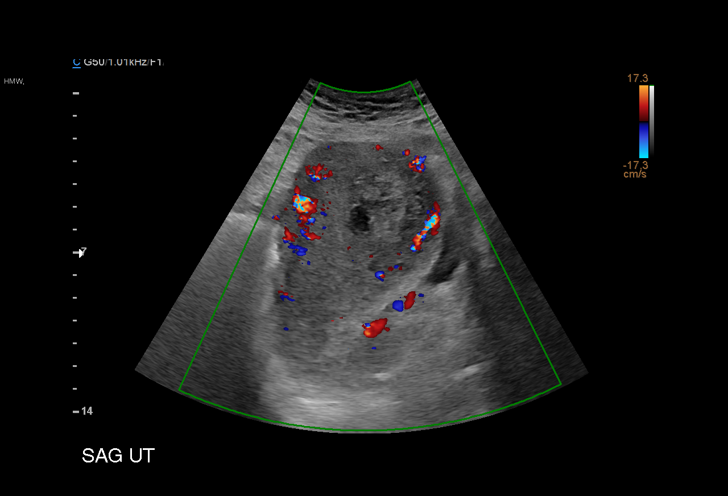
[im 21/36]
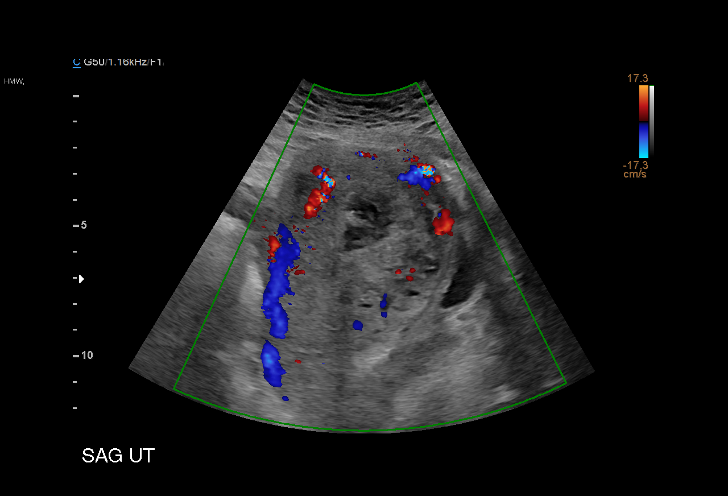
[im 22/36]
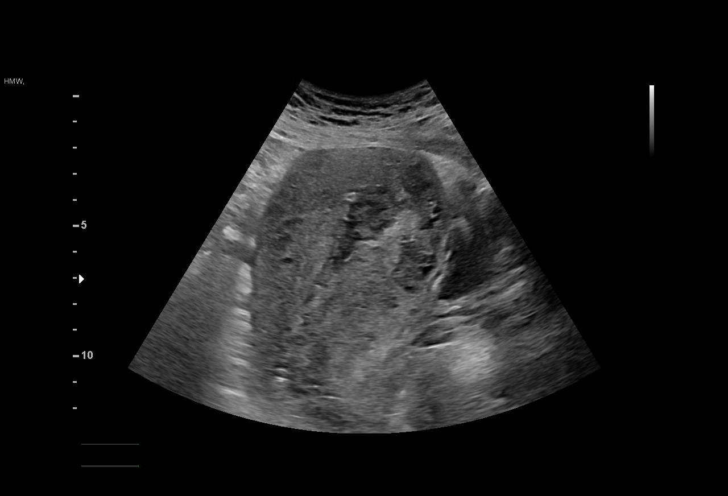
[im 25/36]
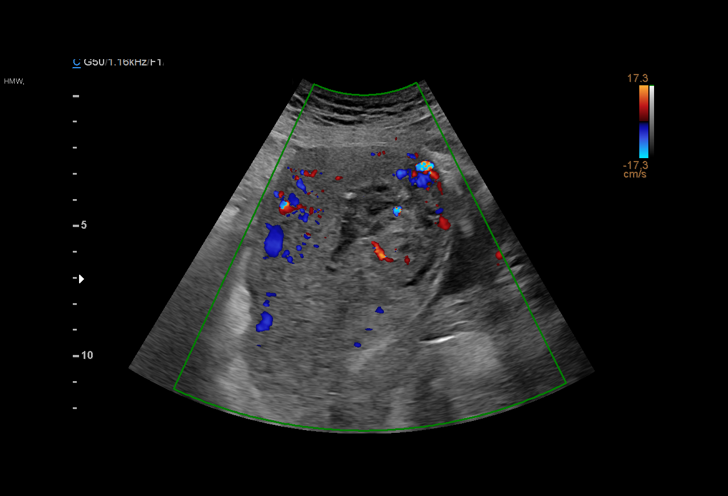
[im 28/36]
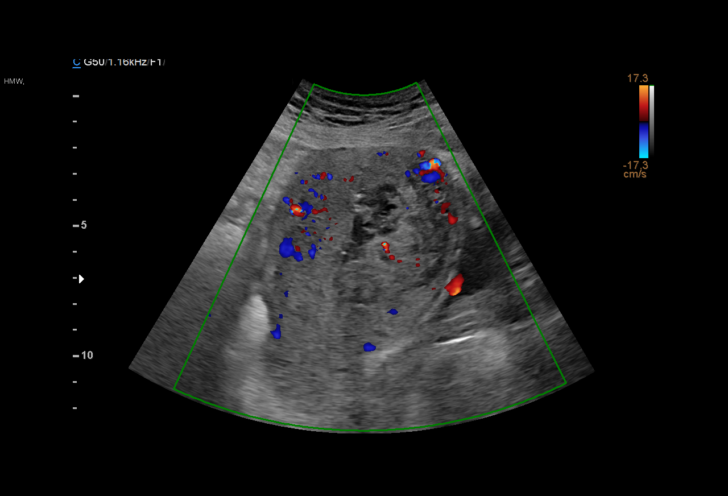
[im 30/36]
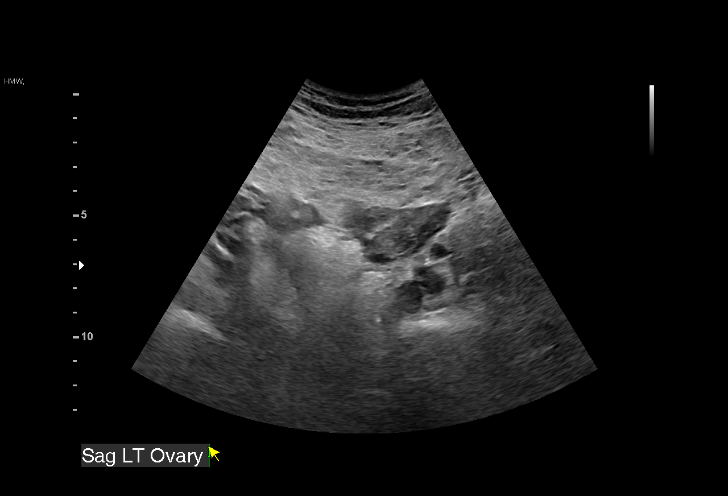
[im 33/36]
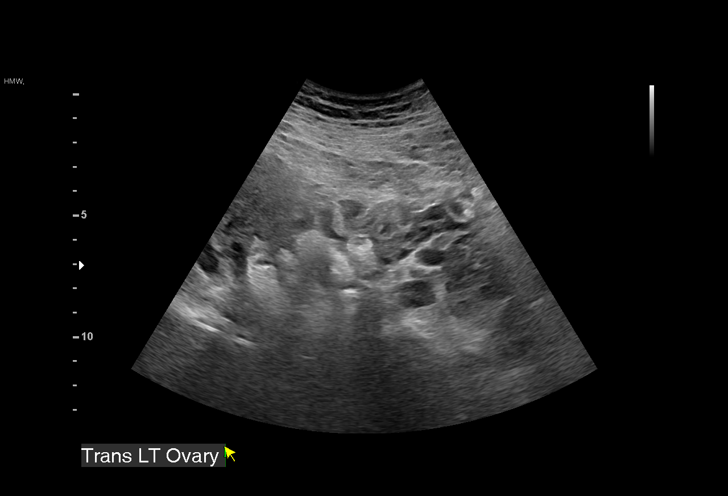
[im 36/36]
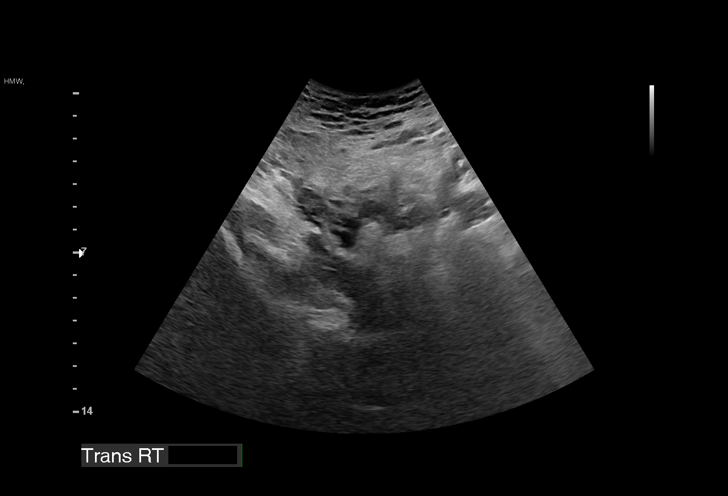

[15 of 25 positions shown; findings below may reference images not displayed]

FINDINGS: Uterus

Measurements: 12.5 x 7.3 x 9.5 cm. Uterus is anteverted. No fibroids
or other mass visualized.

Endometrium

Thickness: 37 mm. Heterogeneous thickened endometrium most
consistent postpartum state. No increased vascularity. Follow-up
exam can be obtained to demonstrate a return to normal.

Right ovary

Measurements: Not visualized.  Normal appearance/no adnexal mass.

Left ovary

Measurements: 4.0 x 1.8 x 2.4 cm. Normal appearance/no adnexal mass.

Other findings:  No abnormal free fluid.
IMPRESSION: Heterogeneous thickened endometrium most consistent with postpartum
state. No increased endometrial vascular flow noted. Follow-up exam
can be obtained to demonstrate a return to normal.

## 2022-03-26 ENCOUNTER — Other Ambulatory Visit (HOSPITAL_COMMUNITY): Payer: Self-pay

## 2022-06-07 ENCOUNTER — Other Ambulatory Visit (HOSPITAL_COMMUNITY): Payer: Self-pay

## 2022-06-07 MED ORDER — NORGESTIMATE-ETH ESTRADIOL 0.25-35 MG-MCG PO TABS
1.0000 | ORAL_TABLET | Freq: Every day | ORAL | 3 refills | Status: DC
  Filled 2022-06-07: qty 84, 84d supply, fill #0

## 2022-06-26 ENCOUNTER — Other Ambulatory Visit (HOSPITAL_COMMUNITY): Payer: Self-pay

## 2022-09-24 ENCOUNTER — Other Ambulatory Visit (HOSPITAL_COMMUNITY): Payer: Self-pay

## 2022-10-02 ENCOUNTER — Other Ambulatory Visit (HOSPITAL_COMMUNITY): Payer: Self-pay

## 2022-10-05 ENCOUNTER — Other Ambulatory Visit (HOSPITAL_COMMUNITY): Payer: Self-pay

## 2022-12-29 ENCOUNTER — Other Ambulatory Visit (HOSPITAL_COMMUNITY): Payer: Self-pay

## 2023-01-03 ENCOUNTER — Other Ambulatory Visit (HOSPITAL_COMMUNITY): Payer: Self-pay

## 2023-01-03 MED ORDER — LEVOCETIRIZINE DIHYDROCHLORIDE 5 MG PO TABS
5.0000 mg | ORAL_TABLET | Freq: Every day | ORAL | 3 refills | Status: AC
  Filled 2023-01-03: qty 90, 90d supply, fill #0
  Filled 2023-03-20: qty 30, 30d supply, fill #1
  Filled 2023-04-16: qty 30, 30d supply, fill #2
  Filled 2023-05-21: qty 30, 30d supply, fill #3
  Filled 2023-06-16: qty 30, 30d supply, fill #4

## 2023-01-04 ENCOUNTER — Other Ambulatory Visit (HOSPITAL_COMMUNITY): Payer: Self-pay

## 2023-01-15 ENCOUNTER — Encounter: Payer: No Typology Code available for payment source | Admitting: Internal Medicine

## 2023-03-20 ENCOUNTER — Other Ambulatory Visit (HOSPITAL_COMMUNITY): Payer: Self-pay

## 2023-03-20 ENCOUNTER — Other Ambulatory Visit: Payer: Self-pay | Admitting: Internal Medicine

## 2023-03-20 DIAGNOSIS — I10 Essential (primary) hypertension: Secondary | ICD-10-CM

## 2023-03-20 NOTE — Telephone Encounter (Signed)
Mychart msg was sent informing pt that she needs to call the office to schedule a TOC appt with a new provider as pt has not been since since January of 23 with Dr. Olivia Mackie. Pt has been informed to book for further refills.

## 2023-03-22 ENCOUNTER — Other Ambulatory Visit (HOSPITAL_COMMUNITY): Payer: Self-pay

## 2023-03-22 MED ORDER — AMLODIPINE BESYLATE 5 MG PO TABS
5.0000 mg | ORAL_TABLET | Freq: Every morning | ORAL | 0 refills | Status: DC
  Filled 2023-03-22: qty 30, 30d supply, fill #0

## 2023-03-26 ENCOUNTER — Encounter: Payer: Self-pay | Admitting: Nurse Practitioner

## 2023-03-26 ENCOUNTER — Other Ambulatory Visit (HOSPITAL_COMMUNITY): Payer: Self-pay

## 2023-03-26 ENCOUNTER — Ambulatory Visit: Payer: BC Managed Care – PPO | Admitting: Nurse Practitioner

## 2023-03-26 VITALS — BP 138/96 | HR 93 | Temp 97.9°F | Ht 62.95 in | Wt 182.2 lb

## 2023-03-26 DIAGNOSIS — Z13220 Encounter for screening for lipoid disorders: Secondary | ICD-10-CM | POA: Diagnosis not present

## 2023-03-26 DIAGNOSIS — E559 Vitamin D deficiency, unspecified: Secondary | ICD-10-CM | POA: Diagnosis not present

## 2023-03-26 DIAGNOSIS — I10 Essential (primary) hypertension: Secondary | ICD-10-CM | POA: Diagnosis not present

## 2023-03-26 DIAGNOSIS — Z1329 Encounter for screening for other suspected endocrine disorder: Secondary | ICD-10-CM | POA: Diagnosis not present

## 2023-03-26 DIAGNOSIS — Z0000 Encounter for general adult medical examination without abnormal findings: Secondary | ICD-10-CM

## 2023-03-26 LAB — COMPREHENSIVE METABOLIC PANEL
ALT: 23 U/L (ref 0–35)
AST: 16 U/L (ref 0–37)
Albumin: 4.4 g/dL (ref 3.5–5.2)
Alkaline Phosphatase: 83 U/L (ref 39–117)
BUN: 15 mg/dL (ref 6–23)
CO2: 23 mEq/L (ref 19–32)
Calcium: 9.1 mg/dL (ref 8.4–10.5)
Chloride: 102 mEq/L (ref 96–112)
Creatinine, Ser: 0.5 mg/dL (ref 0.40–1.20)
GFR: 119.79 mL/min (ref >60.00)
Glucose, Bld: 163 mg/dL — ABNORMAL HIGH (ref 70–99)
Potassium: 4.2 mEq/L (ref 3.5–5.1)
Sodium: 134 mEq/L — ABNORMAL LOW (ref 135–145)
Total Bilirubin: 0.3 mg/dL (ref 0.2–1.2)
Total Protein: 7.1 g/dL (ref 6.0–8.3)

## 2023-03-26 LAB — CBC WITH DIFFERENTIAL/PLATELET
Basophils Absolute: 0.1 10*3/uL (ref 0.0–0.1)
Basophils Relative: 0.6 % (ref 0.0–3.0)
Eosinophils Absolute: 0.2 10*3/uL (ref 0.0–0.7)
Eosinophils Relative: 1.8 % (ref 0.0–5.0)
HCT: 40.1 % (ref 36.0–46.0)
Hemoglobin: 13.8 g/dL (ref 12.0–15.0)
Lymphocytes Relative: 18.1 % (ref 12.0–46.0)
Lymphs Abs: 2.2 10*3/uL (ref 0.7–4.0)
MCHC: 34.4 g/dL (ref 30.0–36.0)
MCV: 83 fl (ref 78.0–100.0)
Monocytes Absolute: 0.7 10*3/uL (ref 0.1–1.0)
Monocytes Relative: 5.8 % (ref 3.0–12.0)
Neutro Abs: 8.9 10*3/uL — ABNORMAL HIGH (ref 1.4–7.7)
Neutrophils Relative %: 73.7 % (ref 43.0–77.0)
Platelets: 364 10*3/uL (ref 150.0–400.0)
RBC: 4.83 Mil/uL (ref 3.87–5.11)
RDW: 13.7 % (ref 11.5–15.5)
WBC: 12 10*3/uL — ABNORMAL HIGH (ref 4.0–10.5)

## 2023-03-26 LAB — LIPID PANEL
Cholesterol: 152 mg/dL (ref 0–200)
HDL: 59.3 mg/dL (ref >39.00)
LDL Cholesterol: 71 mg/dL (ref 0–99)
NonHDL: 92.33
Total CHOL/HDL Ratio: 3
Triglycerides: 106 mg/dL (ref 0.0–149.0)
VLDL: 21.2 mg/dL (ref 0.0–40.0)

## 2023-03-26 LAB — HEMOGLOBIN A1C: Hgb A1c MFr Bld: 7.1 % — ABNORMAL HIGH (ref 4.6–6.5)

## 2023-03-26 LAB — VITAMIN D 25 HYDROXY (VIT D DEFICIENCY, FRACTURES): VITD: 37.01 ng/mL (ref 30.00–100.00)

## 2023-03-26 LAB — TSH: TSH: 1.93 u[IU]/mL (ref 0.35–5.50)

## 2023-03-26 MED ORDER — AMLODIPINE BESYLATE 5 MG PO TABS
5.0000 mg | ORAL_TABLET | Freq: Every morning | ORAL | 3 refills | Status: AC
  Filled 2023-03-26: qty 90, 90d supply, fill #0
  Filled 2023-04-16: qty 30, 30d supply, fill #0
  Filled 2023-05-21: qty 30, 30d supply, fill #1
  Filled 2023-06-16: qty 30, 30d supply, fill #2

## 2023-03-26 MED ORDER — AMLODIPINE BESYLATE 5 MG PO TABS
5.0000 mg | ORAL_TABLET | Freq: Every morning | ORAL | 0 refills | Status: DC
  Filled 2023-03-26: qty 30, 30d supply, fill #0

## 2023-03-26 NOTE — Assessment & Plan Note (Signed)
Elevated reading in office. Home blood pressures reviewed, 110s/70-80s. Had been out of medication, recently got one month filled and started back on it last night. Continue Norvasc 5 mg daily. Refills sent. Encouraged to continue checking blood pressure at home and contact if staying consistently elevated. Will continue to monitor. Discussed importance of healthy diet, exercise and decreasing sodium intake. Dietary/DASH diet information provided.

## 2023-03-26 NOTE — Assessment & Plan Note (Signed)
Chronic. Taking OTC daily supplement. Continue. Will check vitamin D level today.

## 2023-03-26 NOTE — Assessment & Plan Note (Addendum)
Physical exam complete. Lab work as outlined. Will contact patient with results. Pap- UTD. Flu and Tetanus vaccines- UTD. Declines further COVID vaccines. HIV/Hep C screenings negative. Advised annual follow ups with Dentist and Ophthalmology. Encouraged healthy diet and exercise.

## 2023-03-26 NOTE — Progress Notes (Signed)
Tomasita Morrow, NP-C Phone: [phone]  Kimberly Cortez is a 37 y.o. female who presents today for transfer of care and annual exam. She has no complaints or new concerns. She is requesting refills on her Norvasc.   HYPERTENSION Disease Monitoring Home BP Monitoring- 110s/70s-80s Chest pain- No    Dyspnea- No Medications Compliance-  Norvasc. Lightheadedness-  No  Edema- No BMET    Component Value Date/Time   NA 138 01/10/2022 1003   K 4.1 01/10/2022 1003   CL 103 01/10/2022 1003   CO2 28 01/10/2022 1003   GLUCOSE 117 (H) 01/10/2022 1003   BUN 14 01/10/2022 1003   CREATININE 0.51 01/10/2022 1003   CREATININE 0.43 (L) 01/06/2021 1543   CALCIUM 8.9 01/10/2022 1003   GFRNONAA >60 02/02/2021 0504    Diet: Well rounded, snacks a lot Exercise: Walking 20-30 minutes per day Pap smear: 04/24/2022 Family history-  Colon cancer: No  Breast cancer: No  Ovarian cancer: No Menses: IUD Sexually active: Yes Vaccines-   Flu: UTD  Tetanus: 08/13/2013  COVID19: x 3 HIV screening: Negative Hep C Screening: Negative Tobacco use: No Alcohol use: Once a week Illicit Drug use: No Dentist: Yes Ophthalmology: Yes   Social History   Tobacco Use  Smoking Status Never  Smokeless Tobacco Never    Current Outpatient Medications on File Prior to Visit  Medication Sig Dispense Refill   levocetirizine (XYZAL) 5 MG tablet Take 1 tablet (5 mg total) by mouth daily. 90 tablet 3   levonorgestrel (MIRENA, 52 MG,) 20 MCG/DAY IUD Take 1 device by intrauterine route.     VITAMIN D PO Take by mouth daily.     [DISCONTINUED] ferrous sulfate 325 (65 FE) MG tablet Take 1 tablet (325 mg total) by mouth daily with breakfast. (Patient not taking: Reported on 02/01/2021) 30 tablet 3   [DISCONTINUED] norethindrone (MICRONOR) 0.35 MG tablet Take 1 tablet (0.35 mg total) by mouth daily. 28 tablet 11   No current facility-administered medications on file prior to visit.    ROS see history of present  illness  Objective  Physical Exam Vitals:   03/26/23 0812  BP: (!) 138/96  Pulse: 93  Temp: 97.9 F (36.6 C)  SpO2: 99%    BP Readings from Last 3 Encounters:  03/26/23 (!) 138/96  01/10/22 140/90  02/02/21 131/77   Wt Readings from Last 3 Encounters:  03/26/23 182 lb 3.2 oz (82.6 kg)  01/10/22 181 lb 9.6 oz (82.4 kg)  02/01/21 176 lb (79.8 kg)    Physical Exam Constitutional:      General: She is not in acute distress.    Appearance: Normal appearance.  HENT:     Head: Normocephalic.     Right Ear: Tympanic membrane normal.     Left Ear: Tympanic membrane normal.     Nose: Nose normal.     Mouth/Throat:     Mouth: Mucous membranes are moist.     Pharynx: Oropharynx is clear.  Eyes:     Conjunctiva/sclera: Conjunctivae normal.     Pupils: Pupils are equal, round, and reactive to light.  Neck:     Thyroid: No thyromegaly.  Cardiovascular:     Rate and Rhythm: Normal rate and regular rhythm.     Heart sounds: Normal heart sounds.  Pulmonary:     Effort: Pulmonary effort is normal.     Breath sounds: Normal breath sounds.  Abdominal:     General: Abdomen is flat. Bowel sounds are normal.  Palpations: Abdomen is soft. There is no mass.     Tenderness: There is no abdominal tenderness.  Musculoskeletal:        General: Normal range of motion.  Lymphadenopathy:     Cervical: No cervical adenopathy.  Skin:    General: Skin is warm and dry.     Findings: No rash.  Neurological:     General: No focal deficit present.     Mental Status: She is alert.  Psychiatric:        Mood and Affect: Mood normal.        Behavior: Behavior normal.    Assessment/Plan: Please see individual problem list.  Preventative health care Assessment & Plan: Physical exam complete. Lab work as outlined. Will contact patient with results. Pap- UTD. Flu and Tetanus vaccines- UTD. Declines further COVID vaccines. HIV/Hep C screenings negative. Advised annual follow ups with Dentist  and Ophthalmology. Encouraged healthy diet and exercise.  Orders: -     CBC with Differential/Platelet -     Comprehensive metabolic panel -     Hemoglobin A1c  Primary hypertension Assessment & Plan: Elevated reading in office. Home blood pressures reviewed, 110s/70-80s. Had been out of medication, recently got one month filled and started back on it last night. Continue Norvasc 5 mg daily. Refills sent. Encouraged to continue checking blood pressure at home and contact if staying consistently elevated. Will continue to monitor. Discussed importance of healthy diet, exercise and decreasing sodium intake. Dietary/DASH diet information provided.   Orders: -     CBC with Differential/Platelet -     Comprehensive metabolic panel -     amLODIPine Besylate; Take 1 tablet (5 mg total) by mouth every morning.  Dispense: 90 tablet; Refill: 3  Vitamin D deficiency Assessment & Plan: Chronic. Taking OTC daily supplement. Continue. Will check vitamin D level today.   Orders: -     VITAMIN D 25 Hydroxy (Vit-D Deficiency, Fractures)  Lipid screening -     Lipid panel  Thyroid disorder screen -     TSH    Return in about 3 months (around 06/26/2023) for Follow up.   Tomasita Morrow, NP-C [facility]

## 2023-03-27 ENCOUNTER — Other Ambulatory Visit: Payer: Self-pay | Admitting: Nurse Practitioner

## 2023-03-27 ENCOUNTER — Other Ambulatory Visit (HOSPITAL_COMMUNITY): Payer: Self-pay

## 2023-03-27 DIAGNOSIS — E119 Type 2 diabetes mellitus without complications: Secondary | ICD-10-CM

## 2023-03-27 MED ORDER — METFORMIN HCL 500 MG PO TABS
500.0000 mg | ORAL_TABLET | Freq: Two times a day (BID) | ORAL | 1 refills | Status: AC
  Filled 2023-03-27: qty 60, 30d supply, fill #0

## 2023-03-27 NOTE — Assessment & Plan Note (Signed)
New. A1c- 7.1, will start patient on Metformin 500 mg BID. Encouraged healthy diet and exercise. Diabetic diet information provided to patient. Counseled on importance of getting blood sugars down and A1c under control.

## 2023-04-16 ENCOUNTER — Other Ambulatory Visit: Payer: Self-pay

## 2023-04-16 ENCOUNTER — Encounter (HOSPITAL_COMMUNITY): Payer: Self-pay

## 2023-04-16 ENCOUNTER — Other Ambulatory Visit (HOSPITAL_COMMUNITY): Payer: Self-pay

## 2023-05-22 ENCOUNTER — Encounter: Payer: Self-pay | Admitting: Pharmacist

## 2023-05-22 ENCOUNTER — Other Ambulatory Visit (HOSPITAL_COMMUNITY): Payer: Self-pay

## 2023-05-22 ENCOUNTER — Other Ambulatory Visit: Payer: Self-pay

## 2023-06-17 ENCOUNTER — Other Ambulatory Visit (HOSPITAL_COMMUNITY): Payer: Self-pay

## 2023-06-20 ENCOUNTER — Other Ambulatory Visit: Payer: Self-pay

## 2023-06-27 ENCOUNTER — Ambulatory Visit: Payer: BC Managed Care – PPO | Admitting: Nurse Practitioner
